# Patient Record
Sex: Female | Born: 1995 | Race: White | Hispanic: No | Marital: Single | Smoking: Never smoker
Health system: Southern US, Community
[De-identification: ages and names within clinical notes are randomized; demographics above are authoritative.]

## PROBLEM LIST (undated history)

## (undated) DIAGNOSIS — R7303 Prediabetes: Secondary | ICD-10-CM

## (undated) DIAGNOSIS — E669 Obesity, unspecified: Secondary | ICD-10-CM

## (undated) DIAGNOSIS — L0591 Pilonidal cyst without abscess: Secondary | ICD-10-CM

## (undated) DIAGNOSIS — Z6841 Body Mass Index (BMI) 40.0 and over, adult: Secondary | ICD-10-CM

## (undated) DIAGNOSIS — N912 Amenorrhea, unspecified: Secondary | ICD-10-CM

## (undated) DIAGNOSIS — M419 Scoliosis, unspecified: Secondary | ICD-10-CM

## (undated) DIAGNOSIS — R030 Elevated blood-pressure reading, without diagnosis of hypertension: Secondary | ICD-10-CM

## (undated) HISTORY — PX: TONSILLECTOMY AND ADENOIDECTOMY: SHX28

## (undated) HISTORY — DX: Body Mass Index (BMI) 40.0 and over, adult: Z684

## (undated) HISTORY — DX: Amenorrhea, unspecified: N91.2

---
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
## 2017-01-07 IMAGING — US US ABDOMEN COMPLETE
1 series · 13 of 25 positions shown · non-contrast
Comparison: None.

CLINICAL DATA: Generalized abdominal pain and nausea, chronic

EXAM:
ABDOMEN ULTRASOUND COMPLETE

[Series 1: us abdomen complete · 0.18mm/px · 13 of 108 slices shown]
[im 1/108]
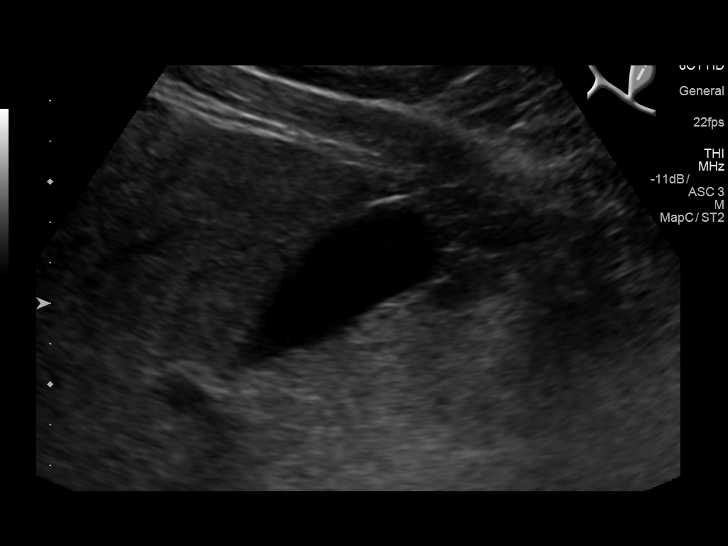
[im 9/108]
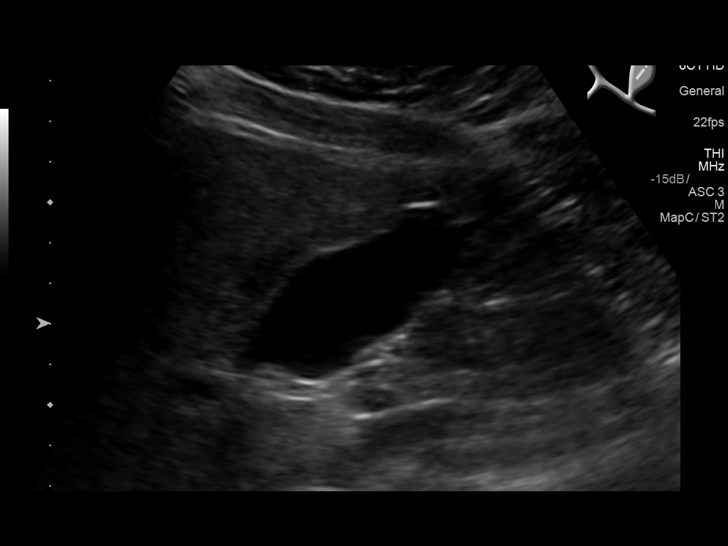
[im 18/108]
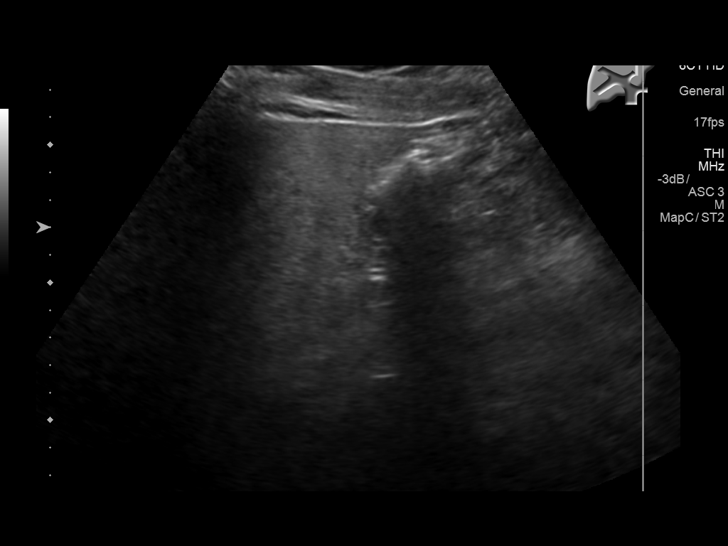
[im 27/108]
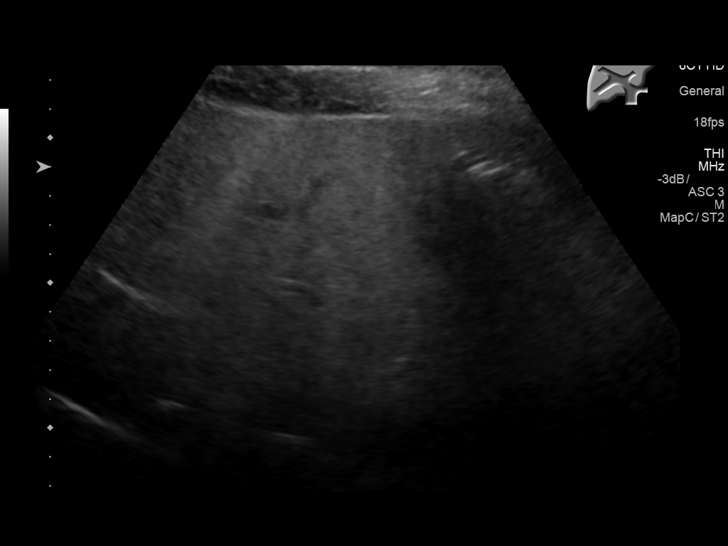
[im 36/108]
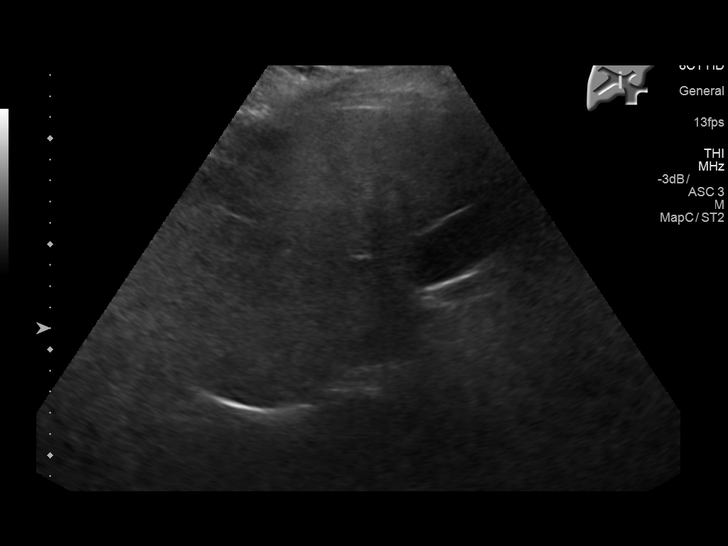
[im 45/108]
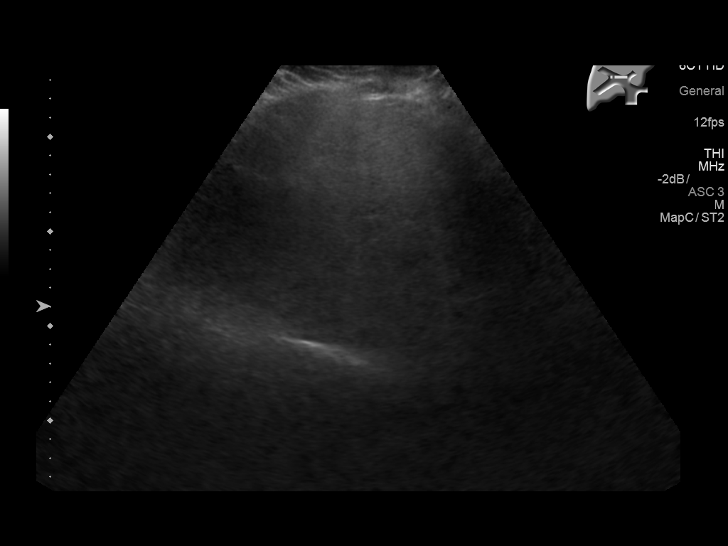
[im 54/108]
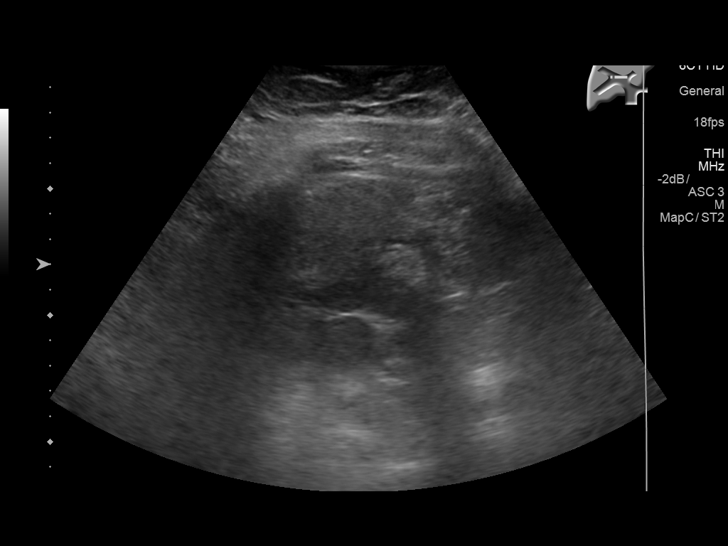
[im 63/108]
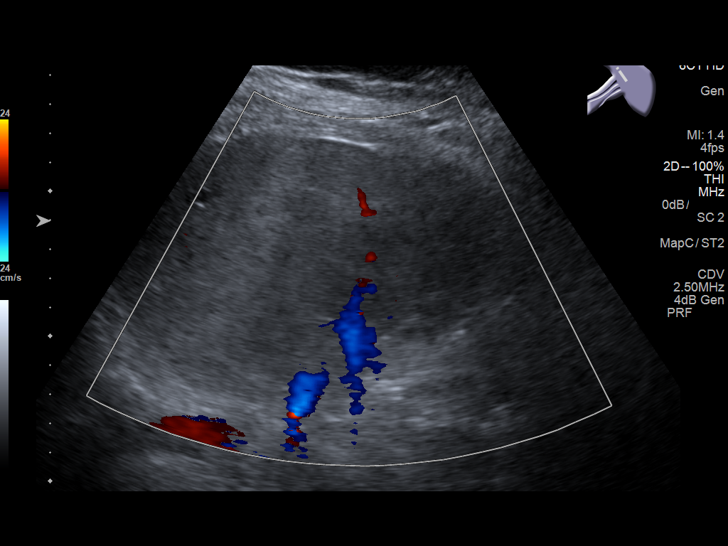
[im 72/108]
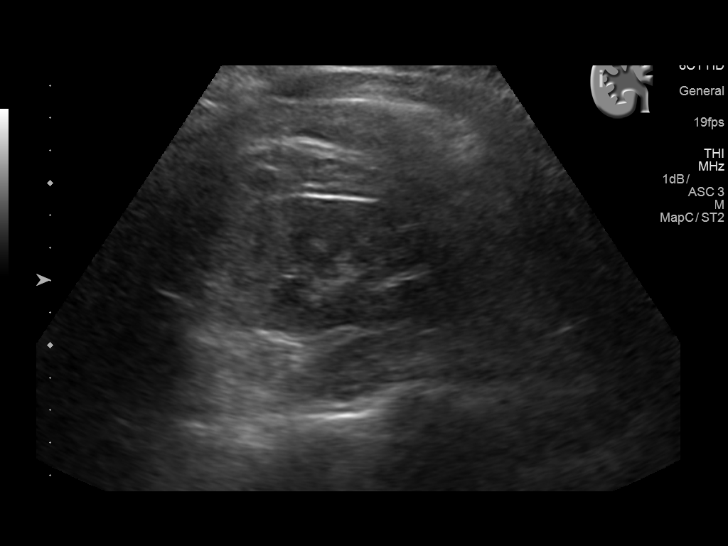
[im 81/108]
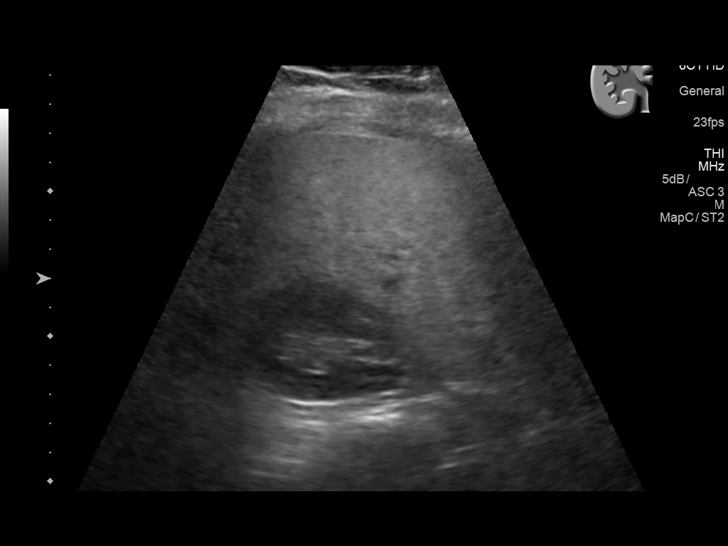
[im 90/108]
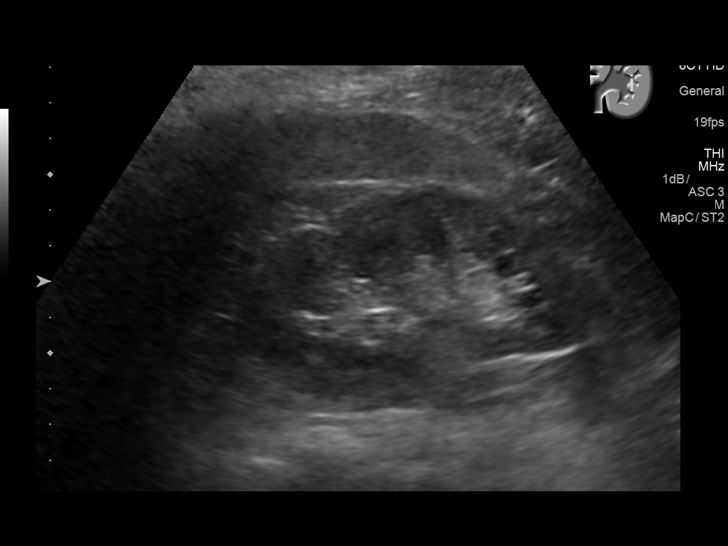
[im 99/108]
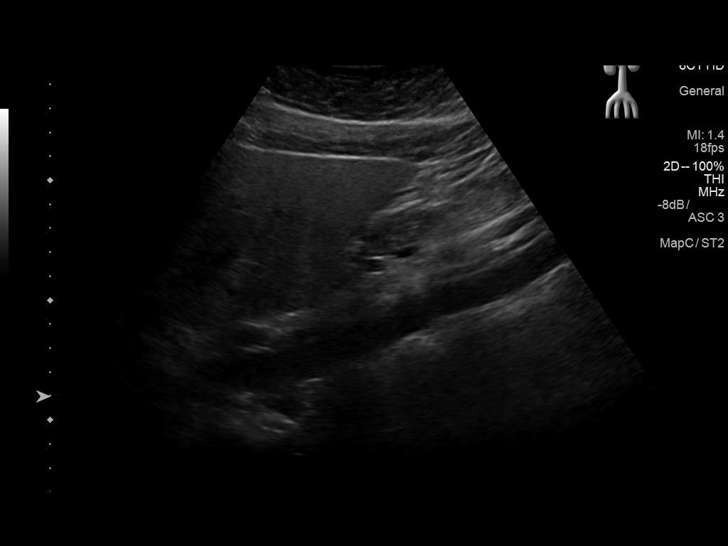
[im 108/108]
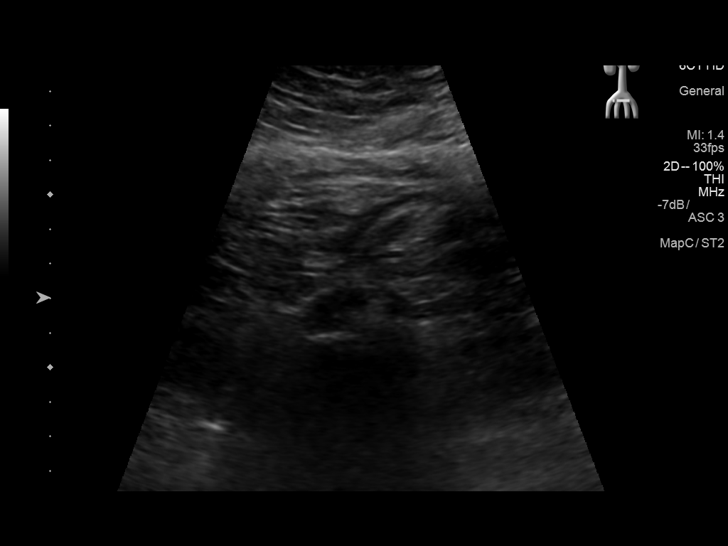

[13 of 25 positions shown; findings below may reference images not displayed]

FINDINGS: Gallbladder: No gallstones or wall thickening visualized. There is
no pericholecystic fluid. No sonographic Murphy sign noted by
sonographer.

Common bile duct: Diameter: 2 mm. There is no intrahepatic, common
hepatic, or common bile duct dilatation.

Liver: No focal lesion identified. Liver echogenicity overall is
increased diffusely.

IVC: No abnormality visualized.

Pancreas: Visualized portion unremarkable. Portions of pancreas
obscured by gas.

Spleen: Spleen is prominent, measuring 12.5 x 13.7 x 5.1 cm with a
measures splenic volume of 457 cubic cm. No focal splenic lesions
are identified.

Right Kidney: Length: 11.1 cm. Echogenicity within normal limits. No
mass or hydronephrosis visualized.

Left Kidney: Length: 11.3 cm. Echogenicity within normal limits. No
mass or hydronephrosis visualized.

Abdominal aorta: No aneurysm visualized.

Other findings: No demonstrable ascites.
IMPRESSION: Diffuse increased liver echogenicity, a finding most likely due to
hepatic steatosis. While no focal liver lesions are identified, it
must be cautioned that the sensitivity of ultrasound for focal liver
lesions is diminished in this circumstance.

Spleen prominent without focal splenic lesion.

Portions of pancreas obscured by gas. Visualized portions of
pancreas appear normal.

Study otherwise unremarkable.

## 2017-03-20 ENCOUNTER — Ambulatory Visit
Admission: RE | Admit: 2017-03-20 | Discharge: 2017-03-20 | Disposition: A | Discharge status: Home / Self Care | Payer: Medicaid Other | Source: Ambulatory Visit | Attending: Pediatrics | Admitting: Pediatrics

## 2017-03-20 ENCOUNTER — Other Ambulatory Visit: Payer: Self-pay | Admitting: Pediatrics

## 2017-03-20 DIAGNOSIS — M545 Low back pain: Secondary | ICD-10-CM | POA: Insufficient documentation

## 2017-03-20 DIAGNOSIS — M419 Scoliosis, unspecified: Secondary | ICD-10-CM | POA: Diagnosis not present

## 2017-09-28 ENCOUNTER — Ambulatory Visit
Admission: RE | Admit: 2017-09-28 | Discharge: 2017-09-28 | Disposition: A | Discharge status: Home / Self Care | Payer: Medicaid Other | Source: Ambulatory Visit | Attending: Allergy and Immunology | Admitting: Allergy and Immunology

## 2017-09-28 ENCOUNTER — Other Ambulatory Visit: Payer: Self-pay | Admitting: Allergy and Immunology

## 2017-09-28 DIAGNOSIS — X58XXXA Exposure to other specified factors, initial encounter: Secondary | ICD-10-CM | POA: Diagnosis not present

## 2017-09-28 DIAGNOSIS — T783XXA Angioneurotic edema, initial encounter: Secondary | ICD-10-CM

## 2018-06-18 IMAGING — CR DG CHEST 2V
2 series · 2 of 2 positions shown · non-contrast
Comparison: None.

CLINICAL DATA: Injury edema. No chest pain. No shortness of breath.

EXAM:
CHEST  2 VIEW

[chest pa]
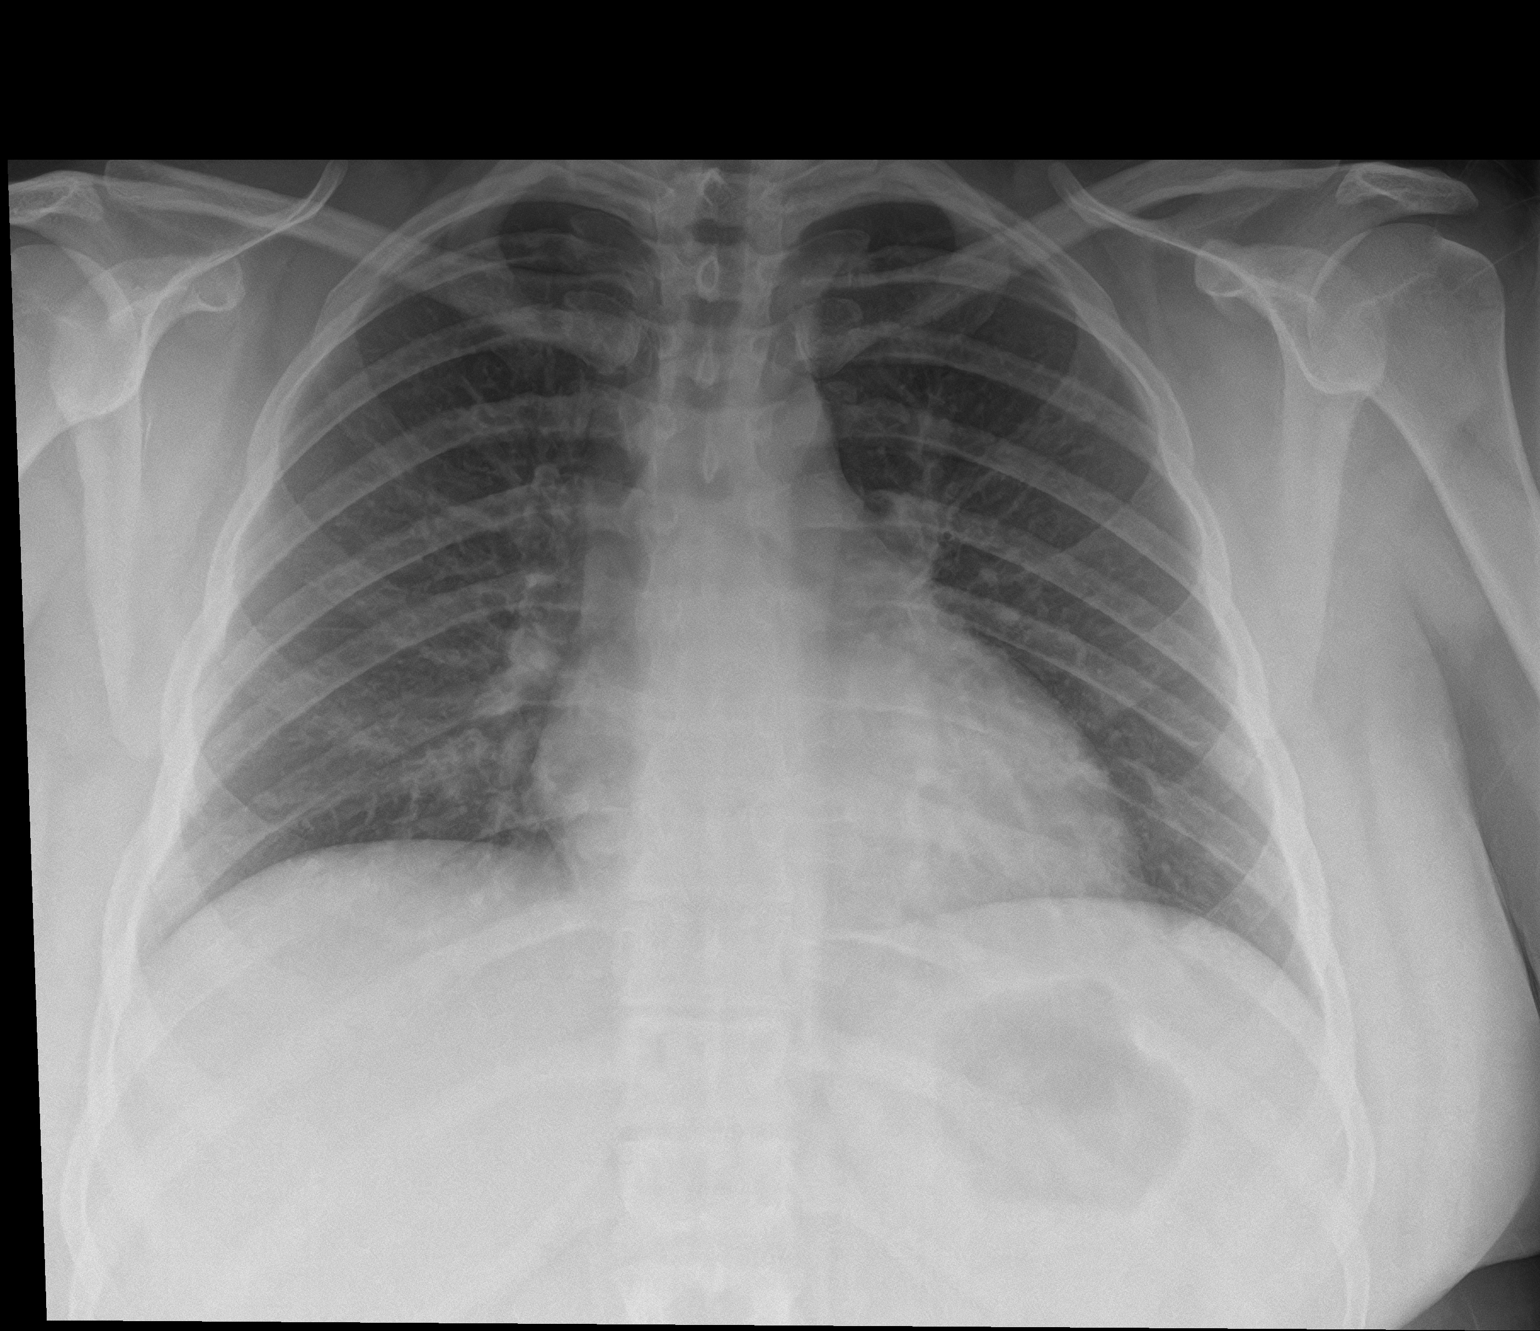

[chest lat]
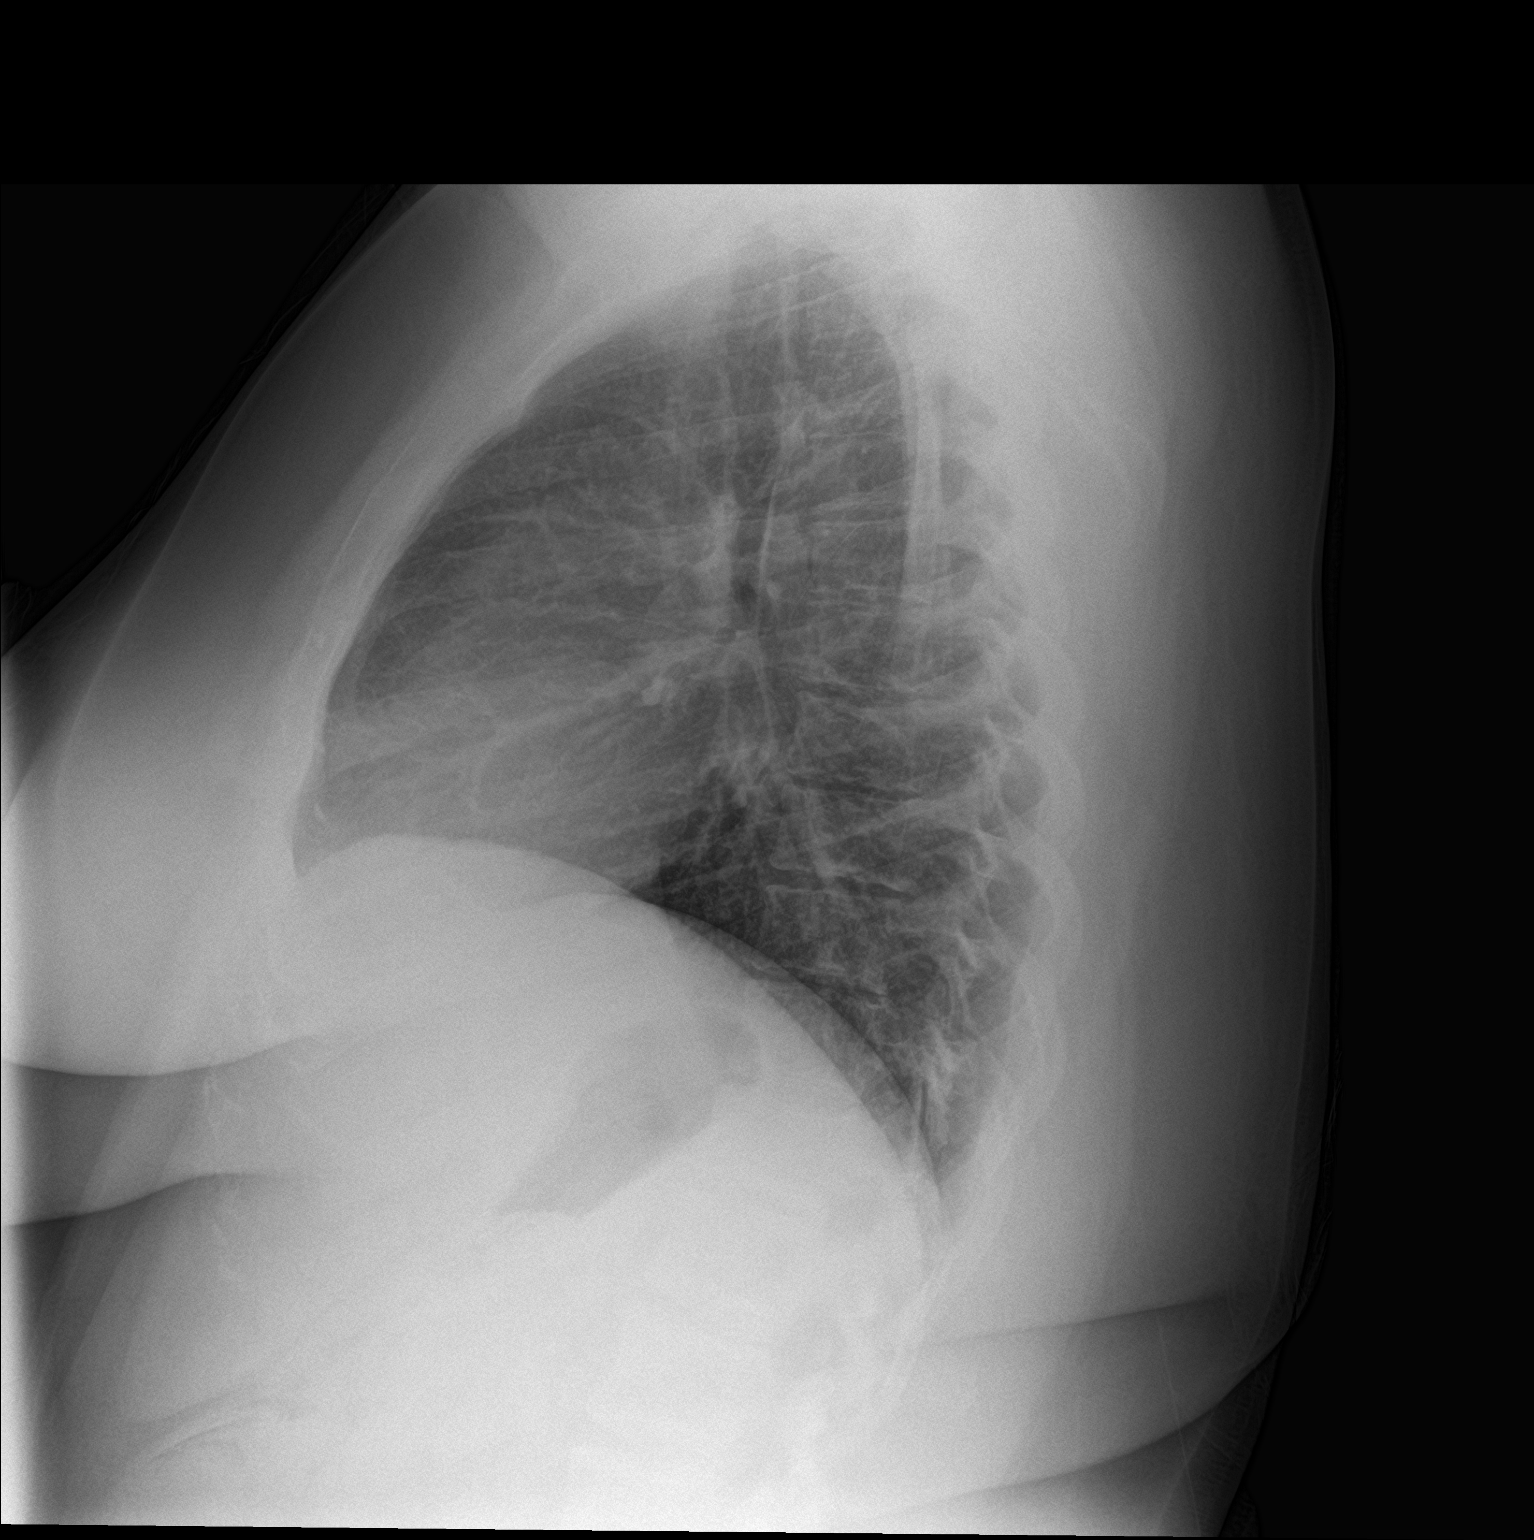

[2 of 2 positions shown; findings below may reference images not displayed]

FINDINGS: The heart size and mediastinal contours are within normal limits.
Both lungs are clear. The visualized skeletal structures are
unremarkable.
IMPRESSION: No active cardiopulmonary disease.

## 2020-03-27 NOTE — Patient Instructions (Signed)
I value your feedback and entrusting us with your care. If you get a [facility] patient survey, I would appreciate you taking the time to let us know about your experience today. Thank you!  As of December 08, 2019, your lab results will be released to your MyChart immediately, before I even have a chance to see them. Please give me time to review them and contact you if there are any abnormalities. Thank you for your patience.  

## 2020-03-27 NOTE — Progress Notes (Signed)
PCP:  Nicola Girt, PA-C   Chief Complaint  Patient presents with  . Gynecologic Exam    pt hasnt had a period for at least a year     HPI:      [name] is a 24 y.o. No obstetric history on file. who LMP was No LMP recorded. (Menstrual status: Irregular Periods)., presents today for her NP annual examination.  Her menses have been absent for the past year and infrequent since about age 35. May have 1 day light spotting with wiping randomly. Menarche age [age] with monthly menses, lasting 7 days, for a couple yrs. Dysmenorrhea none. She does not have intermenstrual bleeding. Never had any eval for amenorrhea/PCOS. No acne/hirsutism.   Sex activity: never active Last Pap: N/A in past due to age Hx of STDs: none  There is no FH of breast cancer. There is a FH of ovarian cancer in her MGM, pt to confirm dx. The patient does not do self-breast exams.  Tobacco use: The patient denies current or previous tobacco use. Alcohol use: social drinker No drug use.  Exercise: not active  She does get adequate calcium but not Vitamin D in her diet. Unsure if Gardasil done.   Pt has FP MCD  Past Medical History:  Diagnosis Date  . Amenorrhea   . BMI 45.0-49.9, adult [facility])     Past Surgical History:  Procedure Laterality Date  . TONSILLECTOMY AND ADENOIDECTOMY      Family History  Problem Relation Age of Onset  . Crohn's disease Brother   . Ovarian cancer Maternal Grandmother     Social History   Socioeconomic History  . Marital status: Single    Spouse name: Not on file  . Number of children: Not on file  . Years of education: Not on file  . Highest education level: Not on file  Occupational History  . Not on file  Tobacco Use  . Smoking status: Never Smoker  . Smokeless tobacco: Never Used  Substance and Sexual Activity  . Alcohol use: Yes  . Drug use: Never  . Sexual activity: Not Currently    Birth control/protection: None  Other Topics  Concern  . Not on file  Social History Narrative  . Not on file   Social Determinants of Health   Financial Resource Strain:   . Difficulty of Paying Living Expenses:   Food Insecurity:   . Worried About Charity fundraiser in the Last Year:   . Arboriculturist in the Last Year:   Transportation Needs:   . Film/video editor (Medical):   Marland Kitchen Lack of Transportation (Non-Medical):   Physical Activity:   . Days of Exercise per Week:   . Minutes of Exercise per Session:   Stress:   . Feeling of Stress :   Social Connections:   . Frequency of Communication with Friends and Family:   . Frequency of Social Gatherings with Friends and Family:   . Attends Religious Services:   . Active Member of Clubs or Organizations:   . Attends Archivist Meetings:   Marland Kitchen Marital Status:   Intimate Partner Violence:   . Fear of Current or Ex-Partner:   . Emotionally Abused:   Marland Kitchen Physically Abused:   . Sexually Abused:      Current Outpatient Medications:  .  medroxyPROGESTERone (PROVERA) 10 MG tablet, Take 1 tablet (10 mg total) by mouth daily for 7 days., Disp: 7 tablet, Rfl:  0     ROS:  Review of Systems  Constitutional: Negative for fatigue, fever and unexpected weight change.  Respiratory: Negative for cough, shortness of breath and wheezing.   Cardiovascular: Negative for chest pain, palpitations and leg swelling.  Gastrointestinal: Negative for blood in stool, constipation, diarrhea, nausea and vomiting.  Endocrine: Negative for cold intolerance, heat intolerance and polyuria.  Genitourinary: Positive for menstrual problem. Negative for dyspareunia, dysuria, flank pain, frequency, genital sores, hematuria, pelvic pain, urgency, vaginal bleeding, vaginal discharge and vaginal pain.  Musculoskeletal: Negative for back pain, joint swelling and myalgias.  Skin: Negative for rash.  Neurological: Negative for dizziness, syncope, light-headedness, numbness and headaches.    Hematological: Negative for adenopathy.  Psychiatric/Behavioral: Negative for agitation, confusion, sleep disturbance and suicidal ideas. The patient is not nervous/anxious.   BREAST: No symptoms   Objective: BP 132/80   Ht 5\' 4"  (1.626 m)   Wt 287 lb (130.2 kg)   BMI 49.26 kg/m    Physical Exam Constitutional:      Appearance: She is well-developed.  Genitourinary:     Vulva, vagina, cervix, uterus, right adnexa and left adnexa normal.     No vulval lesion or tenderness noted.     No vaginal discharge, erythema or tenderness.     No cervical polyp.     Uterus is not enlarged or tender.     No right or left adnexal mass present.     Right adnexa not tender.     Left adnexa not tender.  Neck:     Thyroid: No thyromegaly.  Cardiovascular:     Rate and Rhythm: Normal rate and regular rhythm.     Heart sounds: Normal heart sounds. No murmur.  Pulmonary:     Effort: Pulmonary effort is normal.     Breath sounds: Normal breath sounds.  Chest:     Breasts:        Right: No mass, nipple discharge, skin change or tenderness.        Left: No mass, nipple discharge, skin change or tenderness.  Abdominal:     Palpations: Abdomen is soft.     Tenderness: There is no abdominal tenderness. There is no guarding.  Musculoskeletal:        General: Normal range of motion.     Cervical back: Normal range of motion.  Neurological:     General: No focal deficit present.     Mental Status: She is alert and oriented to person, place, and time.     Cranial Nerves: No cranial nerve deficit.  Skin:    General: Skin is warm and dry.  Psychiatric:        Mood and Affect: Mood normal.        Behavior: Behavior normal.        Thought Content: Thought content normal.        Judgment: Judgment normal.  Vitals reviewed.     Assessment/Plan: Encounter for annual routine gynecological examination  Cervical cancer screening - Plan: Cytology - PAP  Amenorrhea - Plan: medroxyPROGESTERone  (PROVERA) 10 MG tablet; Discussed PCOS vs weight vs other. Rx provera. F/u in 2 1/2 wks via phone re: withdrawal bleed. If has bleeding, suggested Q90 day provera vs OCPs for cycle control. Discussed importance of bleeding. If no withdrawal bleed, recommend further eval, but pt has FP MCD so would be self-pay.   Weight loss counseling--MyFitness Pal/exercise.   Family history of ovarian cancer--pt to confirm FH with mom.   Meds  ordered this encounter  Medications  . medroxyPROGESTERone (PROVERA) 10 MG tablet    Sig: Take 1 tablet (10 mg total) by mouth daily for 7 days.    Dispense:  7 tablet    Refill:  0    Order Specific Question:   Supervising Provider    Answer:   Nadara Mustard [[id number]]             GYN counsel adequate intake of calcium and vitamin D, diet and exercise; Gardasil discussed--pt to check with parents.     F/U  Return in about 1 year (around 03/28/2021).  Nyasha Rahilly B. Atziry Baranski, PA-C 03/28/2020 10:37 AM

## 2020-03-28 ENCOUNTER — Encounter: Payer: Self-pay | Admitting: Obstetrics and Gynecology

## 2020-03-28 ENCOUNTER — Other Ambulatory Visit: Payer: Self-pay

## 2020-03-28 ENCOUNTER — Other Ambulatory Visit (HOSPITAL_COMMUNITY)
Admission: RE | Admit: 2020-03-28 | Discharge: 2020-03-28 | Disposition: A | Discharge status: Home / Self Care | Payer: Medicaid Other | Source: Ambulatory Visit | Attending: Obstetrics and Gynecology | Admitting: Obstetrics and Gynecology

## 2020-03-28 ENCOUNTER — Ambulatory Visit (INDEPENDENT_AMBULATORY_CARE_PROVIDER_SITE_OTHER): Payer: Medicaid Other | Admitting: Obstetrics and Gynecology

## 2020-03-28 VITALS — BP 132/80 | Ht 64.0 in | Wt 287.0 lb

## 2020-03-28 DIAGNOSIS — Z01419 Encounter for gynecological examination (general) (routine) without abnormal findings: Secondary | ICD-10-CM

## 2020-03-28 DIAGNOSIS — Z124 Encounter for screening for malignant neoplasm of cervix: Secondary | ICD-10-CM | POA: Diagnosis present

## 2020-03-28 DIAGNOSIS — N912 Amenorrhea, unspecified: Secondary | ICD-10-CM | POA: Insufficient documentation

## 2020-03-28 DIAGNOSIS — Z0000 Encounter for general adult medical examination without abnormal findings: Secondary | ICD-10-CM | POA: Diagnosis not present

## 2020-03-28 DIAGNOSIS — Z8041 Family history of malignant neoplasm of ovary: Secondary | ICD-10-CM

## 2020-03-28 DIAGNOSIS — Z713 Dietary counseling and surveillance: Secondary | ICD-10-CM

## 2020-03-28 MED ORDER — MEDROXYPROGESTERONE ACETATE 10 MG PO TABS: 10.0000 mg | ORAL_TABLET | Freq: Every day | ORAL | 0 refills | Status: DC

## 2020-03-29 LAB — CYTOLOGY - PAP: Diagnosis: NEGATIVE

## 2020-04-09 ENCOUNTER — Encounter: Payer: Self-pay | Admitting: Obstetrics and Gynecology

## 2020-04-10 ENCOUNTER — Other Ambulatory Visit: Payer: Self-pay | Admitting: Obstetrics and Gynecology

## 2020-04-10 MED ORDER — MICROGESTIN 24 FE 1-20 MG-MCG PO TABS: 1.0000 | ORAL_TABLET | Freq: Every day | ORAL | 3 refills | Status: DC

## 2020-04-10 NOTE — Progress Notes (Signed)
OCP start after withdrawal bleed with provera.

## 2020-06-20 ENCOUNTER — Encounter: Payer: Self-pay | Admitting: Obstetrics and Gynecology

## 2020-10-01 ENCOUNTER — Other Ambulatory Visit: Payer: Medicaid Other

## 2020-10-01 DIAGNOSIS — Z20822 Contact with and (suspected) exposure to covid-19: Secondary | ICD-10-CM

## 2020-10-02 LAB — NOVEL CORONAVIRUS, NAA: SARS-CoV-2, NAA: DETECTED — AB

## 2020-10-02 LAB — SARS-COV-2, NAA 2 DAY TAT

## 2021-08-15 NOTE — Progress Notes (Addendum)
PCP:  Serita Grit, PA-C   Chief Complaint  Patient presents with   Gynecologic Exam    Hasn't had a period since march/April, ran out of bc     HPI:      [name] is a 25 y.o. No obstetric history on file. who LMP was No LMP recorded (lmp unknown). (Menstrual status: Irregular Periods)., presents today for her annual examination.  Her menses were monthly with OCPs last yr, lasting 7 days, mod flow, no BTB, mild dysmen, improved with NSAIDs. Hx of amenorrhea off OCPs for past couple of yrs. Menarche age [age] with monthly menses, lasting 7 days, for a couple yrs then no menses. Never had any eval for amenorrhea/PCOS. No acne/hirsutism. Didn't do labs last yr since pt has FP MCD and would be considered self pay. Did have withdrawal bleed with provera and cycle control will pills.   Sex activity: currently sex active--contraception: was on OCPs, but none since ran out of OCPS. Had neg UPT (not sure how long ago) Last Pap: 03/28/20 Results were normal Hx of STDs: none  There is no FH of breast cancer. There is a FH of ovarian cancer in her MGM and MGGM, pt confirmed dx with her mom. Genetic testing not done. The patient does not do self-breast exams.  Tobacco use: The patient denies current or previous tobacco use. Alcohol use: social drinker No drug use.  Exercise: not active  She does get adequate calcium but not Vitamin D in her diet. Unsure if Gardasil done.   Pt has FP MCD  Past Medical History:  Diagnosis Date   Amenorrhea    BMI 45.0-49.9, adult [facility])     Past Surgical History:  Procedure Laterality Date   TONSILLECTOMY AND ADENOIDECTOMY      Family History  Problem Relation Age of Onset   Crohn's disease Brother    Ovarian cancer Maternal Grandmother    Ovarian cancer Maternal Great-grandmother     Social History   Socioeconomic History   Marital status: Single    Spouse name: Not on file   Number of children: Not on file   Years of  education: Not on file   Highest education level: Not on file  Occupational History   Not on file  Tobacco Use   Smoking status: Never   Smokeless tobacco: Never  Vaping Use   Vaping Use: Never used  Substance and Sexual Activity   Alcohol use: Yes   Drug use: Never   Sexual activity: Yes    Birth control/protection: None  Other Topics Concern   Not on file  Social History Narrative   Not on file   Social Determinants of Health   Financial Resource Strain: Not on file  Food Insecurity: Not on file  Transportation Needs: Not on file  Physical Activity: Not on file  Stress: Not on file  Social Connections: Not on file  Intimate Partner Violence: Not on file     Current Outpatient Medications:    medroxyPROGESTERone (PROVERA) 10 MG tablet, Take 1 tablet (10 mg total) by mouth daily for 7 days., Disp: 7 tablet, Rfl: 0   Norethindrone Acetate-Ethinyl Estrad-FE (MICROGESTIN 24 FE) 1-20 MG-MCG(24) tablet, Take 1 tablet by mouth daily., Disp: 84 tablet, Rfl: 3     ROS:  Review of Systems  Constitutional:  Negative for fatigue, fever and unexpected weight change.  Respiratory:  Negative for cough, shortness of breath and wheezing.   Cardiovascular:  Negative for  chest pain, palpitations and leg swelling.  Gastrointestinal:  Negative for blood in stool, constipation, diarrhea, nausea and vomiting.  Endocrine: Negative for cold intolerance, heat intolerance and polyuria.  Genitourinary:  Positive for menstrual problem. Negative for dyspareunia, dysuria, flank pain, frequency, genital sores, hematuria, pelvic pain, urgency, vaginal bleeding, vaginal discharge and vaginal pain.  Musculoskeletal:  Negative for back pain, joint swelling and myalgias.  Skin:  Negative for rash.  Neurological:  Negative for dizziness, syncope, light-headedness, numbness and headaches.  Hematological:  Negative for adenopathy.  Psychiatric/Behavioral:  Negative for agitation, confusion, sleep  disturbance and suicidal ideas. The patient is not nervous/anxious.  BREAST: No symptoms   Objective: BP 120/80   Ht 5\' 4"  (1.626 m)   Wt 284 lb (128.8 kg)   LMP  (LMP Unknown)   BMI 48.75 kg/m    Physical Exam Constitutional:      Appearance: She is well-developed.  Genitourinary:     Vulva normal.     Right Labia: No rash, tenderness or lesions.    Left Labia: No tenderness, lesions or rash.    No vaginal discharge, erythema or tenderness.      Right Adnexa: not tender and no mass present.    Left Adnexa: not tender and no mass present.    No cervical friability or polyp.     Uterus is not enlarged or tender.  Breasts:    Right: No mass, nipple discharge, skin change or tenderness.     Left: No mass, nipple discharge, skin change or tenderness.  Neck:     Thyroid: No thyromegaly.  Cardiovascular:     Rate and Rhythm: Normal rate and regular rhythm.     Heart sounds: Normal heart sounds. No murmur heard. Pulmonary:     Effort: Pulmonary effort is normal.     Breath sounds: Normal breath sounds.  Abdominal:     Palpations: Abdomen is soft.     Tenderness: There is no abdominal tenderness. There is no guarding or rebound.  Musculoskeletal:        General: Normal range of motion.     Cervical back: Normal range of motion.  Lymphadenopathy:     Cervical: No cervical adenopathy.  Neurological:     General: No focal deficit present.     Mental Status: She is alert and oriented to person, place, and time.     Cranial Nerves: No cranial nerve deficit.  Skin:    General: Skin is warm and dry.  Psychiatric:        Mood and Affect: Mood normal.        Behavior: Behavior normal.        Thought Content: Thought content normal.        Judgment: Judgment normal.  Vitals reviewed.    Assessment/Plan: Encounter for annual routine gynecological examination  Screening for STD (sexually transmitted disease) - Plan: Cervicovaginal ancillary only  Family history of ovarian  cancer--MyRisk testing discussed and pt to f/u if desires.   Amenorrhea - Plan: medroxyPROGESTERone (PROVERA) 10 MG tablet; pt to do Rx provera after neg first AM UPT, then restart OCPs. F/u if no withdrawal bleed.  Encounter for surveillance of contraceptive pills - Plan: Norethindrone Acetate-Ethinyl Estrad-FE (MICROGESTIN 24 FE) 1-20 MG-MCG(24) tablet; OCP RF. F/u prn.    Meds ordered this encounter  Medications   medroxyPROGESTERone (PROVERA) 10 MG tablet    Sig: Take 1 tablet (10 mg total) by mouth daily for 7 days.    Dispense:  7  tablet    Refill:  0    Order Specific Question:   Supervising Provider    Answer:   Nadara Mustard [[id number]]   Norethindrone Acetate-Ethinyl Estrad-FE (MICROGESTIN 24 FE) 1-20 MG-MCG(24) tablet    Sig: Take 1 tablet by mouth daily.    Dispense:  84 tablet    Refill:  3    Order Specific Question:   Supervising Provider    Answer:   Nadara Mustard [[id number]]              GYN counsel adequate intake of calcium and vitamin D, diet and exercise   F/U  Return in about 1 year (around 08/19/2022).  Negar Sieler B. Jacquita Mulhearn, PA-C 08/19/2021 9:25 AM

## 2021-08-19 ENCOUNTER — Encounter: Payer: Self-pay | Admitting: Obstetrics and Gynecology

## 2021-08-19 ENCOUNTER — Ambulatory Visit (INDEPENDENT_AMBULATORY_CARE_PROVIDER_SITE_OTHER): Payer: Medicaid Other | Admitting: Obstetrics and Gynecology

## 2021-08-19 ENCOUNTER — Other Ambulatory Visit: Payer: Self-pay

## 2021-08-19 ENCOUNTER — Other Ambulatory Visit (HOSPITAL_COMMUNITY)
Admission: RE | Admit: 2021-08-19 | Discharge: 2021-08-19 | Disposition: A | Discharge status: Home / Self Care | Payer: Medicaid Other | Source: Ambulatory Visit | Attending: Obstetrics and Gynecology | Admitting: Obstetrics and Gynecology

## 2021-08-19 VITALS — BP 120/80 | Ht 64.0 in | Wt 284.0 lb

## 2021-08-19 DIAGNOSIS — Z113 Encounter for screening for infections with a predominantly sexual mode of transmission: Secondary | ICD-10-CM | POA: Insufficient documentation

## 2021-08-19 DIAGNOSIS — Z01419 Encounter for gynecological examination (general) (routine) without abnormal findings: Secondary | ICD-10-CM

## 2021-08-19 DIAGNOSIS — N912 Amenorrhea, unspecified: Secondary | ICD-10-CM

## 2021-08-19 DIAGNOSIS — Z8041 Family history of malignant neoplasm of ovary: Secondary | ICD-10-CM | POA: Diagnosis not present

## 2021-08-19 DIAGNOSIS — Z3041 Encounter for surveillance of contraceptive pills: Secondary | ICD-10-CM

## 2021-08-19 MED ORDER — MEDROXYPROGESTERONE ACETATE 10 MG PO TABS: 10.0000 mg | ORAL_TABLET | Freq: Every day | ORAL | 0 refills | Status: DC

## 2021-08-19 MED ORDER — MICROGESTIN 24 FE 1-20 MG-MCG PO TABS: 1.0000 | ORAL_TABLET | Freq: Every day | ORAL | 3 refills | Status: DC

## 2021-08-20 LAB — CERVICOVAGINAL ANCILLARY ONLY
Chlamydia: NEGATIVE
Comment: NEGATIVE
Comment: NORMAL
Neisseria Gonorrhea: NEGATIVE

## 2022-07-23 ENCOUNTER — Ambulatory Visit (INDEPENDENT_AMBULATORY_CARE_PROVIDER_SITE_OTHER): Payer: Self-pay | Admitting: Advanced Practice Midwife

## 2022-07-23 VITALS — BP 126/84 | Ht 64.0 in | Wt 310.0 lb

## 2022-07-23 DIAGNOSIS — L83 Acanthosis nigricans: Secondary | ICD-10-CM

## 2022-07-23 DIAGNOSIS — N912 Amenorrhea, unspecified: Secondary | ICD-10-CM

## 2022-07-23 DIAGNOSIS — Z6841 Body Mass Index (BMI) 40.0 and over, adult: Secondary | ICD-10-CM

## 2022-07-23 DIAGNOSIS — L814 Other melanin hyperpigmentation: Secondary | ICD-10-CM

## 2022-07-23 NOTE — Patient Instructions (Addendum)
Acanthosis Nigricans Acanthosis nigricans is a condition in which dark, velvety markings appear on the skin. What are the causes? This condition may be caused by: A hormonal or glandular disorder, such as diabetes. Obesity. Certain medicines, such as birth control pills. A tumor. This is rare. Some people inherit the condition from their parents. What increases the risk? You are more likely to develop this condition if you: Have a hormonal or glandular disorder. Are overweight. Take certain medicines. Have certain cancers, especially stomach cancer. Have dark-colored skin (dark complexion). What are the signs or symptoms? The main symptom of this condition is velvety markings on the skin that are light brown, black, or grayish in color. The markings usually appear on the face. They may also appear in skin fold areas at the neck, armpits, inner thighs, and groin. In severe cases, markings may also appear on the lips, hands, breasts, eyelids, and mouth. How is this diagnosed? This condition may be diagnosed based on your symptoms. A skin sample may be removed for testing (skin biopsy). You may also have tests to help determine the cause of the condition. How is this treated? Treatment for this condition depends on the cause. Treatment may involve reducing insulin levels, which are often high in people who have this condition. Insulin levels can be reduced with: Dietary changes, such as avoiding starchy foods and sugars. Losing weight. Medicines. Sometimes, treatment involves: Medicines to improve the appearance of the skin. Laser treatment to improve the appearance of the skin. Surgical removal of the skin markings (dermabrasion). Follow these instructions at home: Follow diet instructions from your health care provider. Lose weight if you are overweight. Take over-the-counter and prescription medicines only as told by your health care provider. Keep all follow-up visits as told by  your health care provider. This is important. Contact a health care provider if: New skin markings develop on a part of the body where they rarely develop, such as on your lips, hands, breasts, eyelids, or mouth. The condition recurs for an unknown reason. Summary Acanthosis nigricans is a condition in which dark, velvety markings appear on the skin. Treatment for this condition depends on the cause. Treatment may include dietary changes, medicines, laser treatment, or surgery. Take over-the-counter and prescription medicines only as told by your health care provider. Contact a health care provider if new skin markings develop on a part of the body where they rarely develop, such as on your lips, hands, breasts, eyelids, or mouth. Keep all follow-up visits as told by your health care provider. This is important. This information is not intended to replace advice given to you by your health care provider. Make sure you discuss any questions you have with your health care provider. Document Revised: 10/10/2021 Document Reviewed: 10/10/2021 Elsevier Patient Education  2023 [employer]. Polycystic Ovary Syndrome  Polycystic ovarian syndrome (PCOS) is a common hormonal disorder among women of reproductive age. In most women with PCOS, small fluid-filled sacs (cysts) grow on the ovaries. PCOS can cause problems with menstrual periods and make it hard to get and stay pregnant. If this condition is not treated, it can lead to serious health problems, such as diabetes and heart disease. What are the causes? The cause of this condition is not known. It may be due to certain factors, such as: Irregular menstrual cycle. High levels of certain hormones. Problems with the hormone that helps to control blood sugar (insulin). Certain genes. What increases the risk? You are more likely to develop this  condition if you: Have a family history of PCOS or type 2 diabetes. Are overweight, eat unhealthy foods,  and are not active. These factors may cause problems with blood sugar control, which can contribute to PCOS or PCOS symptoms. What are the signs or symptoms? Symptoms of this condition include: Ovarian cysts and sometimes pelvic pain. Menstrual periods that are not regular or are too heavy. Inability to get or stay pregnant. Increased growth of hair on the face, chest, stomach, back, thumbs, thighs, or toes. Acne or oily skin. Acne may develop during adulthood, and it may not get better with treatment. Weight gain or obesity. Patches of thickened and dark brown or black skin on the neck, arms, breasts, or thighs. How is this diagnosed? This condition is diagnosed based on: Your medical history. A physical exam that includes a pelvic exam. Your health care provider may look for areas of increased hair growth on your skin. Tests, such as: An ultrasound to check the ovaries for cysts and to view the lining of the uterus. Blood tests to check levels of sugar (glucose), female hormone (testosterone), and female hormones (estrogen and progesterone). How is this treated? There is no cure for this condition, but treatment can help to manage symptoms and prevent more health problems from developing. Treatment varies depending on your symptoms and if you want to have a baby or if you need birth control. Treatment may include: Making nutrition and lifestyle changes. Taking the progesterone hormone to start a menstrual period. Taking birth control pills to help you have regular menstrual periods. Taking medicines such as: Medicines to make you ovulate, if you want to get pregnant. Medicine to reduce extra hair growth. Having surgery in severe cases. This may involve making small holes in one or both of your ovaries. This decreases the amount of testosterone that your body makes. Follow these instructions at home: Take over-the-counter and prescription medicines only as told by your health care  provider. Follow a healthy meal plan that includes lean proteins, complex carbohydrates, fresh fruits and vegetables, low-fat dairy products, healthy fats, and fiber. If you are overweight, lose weight as told by your health care provider. Your health care provider can determine how much weight loss is best for you and can help you lose weight safely. Keep all follow-up visits. This is important. Contact a health care provider if: Your symptoms do not get better with medicine. Your symptoms get worse or you develop new symptoms. Summary Polycystic ovarian syndrome (PCOS) is a common hormonal disorder among women of reproductive age. PCOS can cause problems with menstrual periods and make it hard to get and stay pregnant. If this condition is not treated, it can lead to serious health problems, such as diabetes and heart disease. There is no cure for this condition, but treatment can help to manage symptoms and prevent more health problems from developing. This information is not intended to replace advice given to you by your health care provider. Make sure you discuss any questions you have with your health care provider. Document Revised: 05/24/2020 Document Reviewed: 05/24/2020 Elsevier Patient Education  2023 [employer] Inc. Diet for Polycystic Ovary Syndrome Polycystic ovary syndrome (PCOS) is a common hormonal disorder that affects a woman's reproductive system. It can cause problems with menstrual periods and make it hard to get and stay pregnant. Changing what you eat can help your hormones reach normal levels, improve your health, and help you better manage PCOS. Following a balanced diet can help you lose  weight and improve the way that your body uses the hormone insulin to control blood sugar. This may include: Eating low-fat (lean) proteins, complex carbohydrates, fresh fruits and vegetables, low-fat dairy products, healthy fats, and fiber. Cutting down on calories. Exercising  regularly. What are tips for following this plan? Follow a balanced diet for meals and snacks. Eat breakfast, lunch, dinner, and one or two snacks every day. Include protein in each meal and snack. Choose whole grains instead of products that are made with refined flour. Eat a variety of foods. Exercise regularly as told by your health care provider. Aim to do at least 30 minutes of exercise on most days of the week. If you are overweight or obese: Pay attention to how many calories you eat. Cutting down on calories can help you lose weight. Work with your health care provider or a dietitian to figure out how many calories you need each day. What foods should I eat?  Fruits Include a variety of colors and types. All fruits are helpful for PCOS. Vegetables Include a variety of colors and types. All vegetables are helpful for PCOS. Grains Whole grains, such as whole wheat. Whole-grain breads, crackers, cereals, and pasta. Unsweetened oatmeal. Bulgur, barley, quinoa, and brown rice. Tortillas made from corn or whole-wheat flour. Meats and other proteins Lean proteins, such as fish, chicken, beans, eggs, and tofu. Dairy Low-fat dairy products, such as skim milk, cheese sticks, and yogurt. Beverages Low-fat or fat-free drinks, such as water, low-fat milk, sugar-free drinks, and small amounts of 100% fruit juice. Seasonings and condiments Ketchup. Mustard. Barbecue sauce. Relish. Low-fat or fat-free mayonnaise. Fats and oils Olive oil or canola oil. Walnuts and almonds. The items listed above may not be a complete list of recommended foods and beverages. Contact a dietitian for more options. What foods should I avoid? Foods that are high in calories or fat, especially saturated or trans fats. Fried foods. Sweets. Products that are made from refined white flour, including white bread, pastries, white rice, and pasta. The items listed above may not be a complete list of foods and beverages to  avoid. Contact a dietitian for more information. Summary PCOS is a hormonal imbalance that affects a woman's reproductive system. It can cause problems with menstrual periods and make it hard to get and stay pregnant. You can help to manage your PCOS by exercising regularly and eating a healthy, varied diet of vegetables, fruit, whole grains, lean protein, and low-fat dairy products. Changing what you eat can improve the way that your body uses insulin, help your hormones reach normal levels, and help you lose weight. This information is not intended to replace advice given to you by your health care provider. Make sure you discuss any questions you have with your health care provider. Document Revised: 05/24/2020 Document Reviewed: 05/24/2020 Elsevier Patient Education  [place] refers to food and lifestyle choices that are based on the traditions of countries located on the [employer]. It focuses on eating more fruits, vegetables, whole grains, beans, nuts, seeds, and heart-healthy fats, and eating less dairy, meat, eggs, and processed foods with added sugar, salt, and fat. This way of eating has been shown to help prevent certain conditions and improve outcomes for people who have chronic diseases, like kidney disease and heart disease. What are tips for following this plan? Reading food labels Check the serving size of packaged foods. For foods such as rice and pasta, the serving size refers  to the amount of cooked product, not dry. Check the total fat in packaged foods. Avoid foods that have saturated fat or trans fats. Check the ingredient list for added sugars, such as corn syrup. Shopping  Buy a variety of foods that offer a balanced diet, including: Fresh fruits and vegetables (produce). Grains, beans, nuts, and seeds. Some of these may be available in unpackaged forms or large amounts (in bulk). Fresh seafood. Poultry and  eggs. Low-fat dairy products. Buy whole ingredients instead of prepackaged foods. Buy fresh fruits and vegetables in-season from local farmers markets. Buy plain frozen fruits and vegetables. If you do not have access to quality fresh seafood, buy precooked frozen shrimp or canned fish, such as tuna, salmon, or sardines. Stock your pantry so you always have certain foods on hand, such as olive oil, canned tuna, canned tomatoes, rice, pasta, and beans. Cooking Cook foods with extra-virgin olive oil instead of using butter or other vegetable oils. Have meat as a side dish, and have vegetables or grains as your main dish. This means having meat in small portions or adding small amounts of meat to foods like pasta or stew. Use beans or vegetables instead of meat in common dishes like chili or lasagna. Experiment with different cooking methods. Try roasting, broiling, steaming, and sauting vegetables. Add frozen vegetables to soups, stews, pasta, or rice. Add nuts or seeds for added healthy fats and plant protein at each meal. You can add these to yogurt, salads, or vegetable dishes. Marinate fish or vegetables using olive oil, lemon juice, garlic, and fresh herbs. Meal planning Plan to eat one vegetarian meal one day each week. Try to work up to two vegetarian meals, if possible. Eat seafood two or more times a week. Have healthy snacks readily available, such as: Vegetable sticks with hummus. Greek yogurt. Fruit and nut trail mix. Eat balanced meals throughout the week. This includes: Fruit: 2-3 servings a day. Vegetables: 4-5 servings a day. Low-fat dairy: 2 servings a day. Fish, poultry, or lean meat: 1 serving a day. Beans and legumes: 2 or more servings a week. Nuts and seeds: 1-2 servings a day. Whole grains: 6-8 servings a day. Extra-virgin olive oil: 3-4 servings a day. Limit red meat and sweets to only a few servings a month. Lifestyle  Cook and eat meals together with your  family, when possible. Drink enough fluid to keep your urine pale yellow. Be physically active every day. This includes: Aerobic exercise like running or swimming. Leisure activities like gardening, walking, or housework. Get 7-8 hours of sleep each night. If recommended by your health care provider, drink red wine in moderation. This means 1 glass a day for nonpregnant women and 2 glasses a day for men. A glass of wine equals 5 oz (150 mL). What foods should I eat? Fruits Apples. Apricots. Avocado. Berries. Bananas. Cherries. Dates. Figs. Grapes. Lemons. Melon. Oranges. Peaches. Plums. Pomegranate. Vegetables Artichokes. Beets. Broccoli. Cabbage. Carrots. Eggplant. Green beans. Chard. Kale. Spinach. Onions. Leeks. Peas. Squash. Tomatoes. Peppers. Radishes. Grains Whole-grain pasta. Brown rice. Bulgur wheat. Polenta. Couscous. Whole-wheat bread. [place]. Meats and other proteins Beans. Almonds. Sunflower seeds. Pine nuts. Peanuts. [place]. Salmon. Scallops. Shrimp. [place]. Tilapia. Clams. Oysters. Eggs. Poultry without skin. Dairy Low-fat milk. Cheese. Greek yogurt. Fats and oils Extra-virgin olive oil. Avocado oil. Grapeseed oil. Beverages Water. Red wine. Herbal tea. Sweets and desserts Greek yogurt with honey. Baked apples. Poached pears. Trail mix. Seasonings and condiments Basil. Cilantro. Coriander. Cumin. Mint. Parsley. Sage.  Rosemary. Tarragon. Garlic. Oregano. Thyme. Pepper. Balsamic vinegar. Tahini. Hummus. Tomato sauce. Olives. Mushrooms. The items listed above may not be a complete list of foods and beverages you can eat. Contact a dietitian for more information. What foods should I limit? This is a list of foods that should be eaten rarely or only on special occasions. Fruits Fruit canned in syrup. Vegetables Deep-fried potatoes (french fries). Grains Prepackaged pasta or rice dishes. Prepackaged cereal with added sugar. Prepackaged snacks with added sugar. Meats and  other proteins Beef. Pork. Lamb. Poultry with skin. Hot dogs. Berniece Salines. Dairy Ice cream. Sour cream. Whole milk. Fats and oils Butter. Canola oil. Vegetable oil. Beef fat (tallow). Lard. Beverages Juice. Sugar-sweetened soft drinks. Beer. Liquor and spirits. Sweets and desserts Cookies. Cakes. Pies. Candy. Seasonings and condiments Mayonnaise. Pre-made sauces and marinades. The items listed above may not be a complete list of foods and beverages you should limit. Contact a dietitian for more information. Summary The Mediterranean diet includes both food and lifestyle choices. Eat a variety of fresh fruits and vegetables, beans, nuts, seeds, and whole grains. Limit the amount of red meat and sweets that you eat. If recommended by your health care provider, drink red wine in moderation. This means 1 glass a day for nonpregnant women and 2 glasses a day for men. A glass of wine equals 5 oz (150 mL). This information is not intended to replace advice given to you by your health care provider. Make sure you discuss any questions you have with your health care provider. Document Revised: 01/20/2020 Document Reviewed: 11/17/2019 Elsevier Patient Education  [place].

## 2022-07-24 ENCOUNTER — Encounter: Payer: Self-pay | Admitting: Advanced Practice Midwife

## 2022-07-24 DIAGNOSIS — Z6841 Body Mass Index (BMI) 40.0 and over, adult: Secondary | ICD-10-CM | POA: Insufficient documentation

## 2022-07-24 NOTE — Progress Notes (Signed)
Patient ID: KRISALYN YANKOWSKI, female   DOB: 05/15/1996, 26 y.o.   MRN: [id number]  Reason for Consult: Consult (Consult for PCOS. Pt not sure of LMP )   Subjective:  Date of Service: 07/23/2022  HPI:  ROSANN Klein is a 26 y.o. female being seen for concerns for pigment changes and PCOS workup. She has noticed darkening of the skin around the base of her neck for the past 1-2 years. Her mother was concerned as she thinks it can be related to PCOS. She denies having menstrual periods unless she is taking birth control pills. She has taken pills for the past year and a half except for the past few months. She occasionally takes a pregnancy test since she doesn't have periods. She admits her diet is primarily fast food, carbohydrate and soda heavy. She denies any other known health concerns.  Past Medical History:  Diagnosis Date   Amenorrhea    BMI 45.0-49.9, adult (HCC)    Family History  Problem Relation Age of Onset   Crohn's disease Brother    Ovarian cancer Maternal Grandmother    Ovarian cancer Maternal Great-grandmother    Past Surgical History:  Procedure Laterality Date   TONSILLECTOMY AND ADENOIDECTOMY      Short Social History:  Social History   Tobacco Use   Smoking status: Never   Smokeless tobacco: Never  Substance Use Topics   Alcohol use: Yes    Allergies  Allergen Reactions   Other Swelling    Beef and pork    Current Outpatient Medications  Medication Sig Dispense Refill   Norethindrone Acetate-Ethinyl Estrad-FE (MICROGESTIN 24 FE) 1-20 MG-MCG(24) tablet Take 1 tablet by mouth daily. 84 tablet 3   medroxyPROGESTERone (PROVERA) 10 MG tablet Take 1 tablet (10 mg total) by mouth daily for 7 days. 7 tablet 0   No current facility-administered medications for this visit.    Review of Systems  Constitutional:  Negative for chills and fever.  HENT:  Negative for congestion, ear discharge, ear pain, hearing loss, sinus pain and sore throat.   Eyes:   Negative for blurred vision and double vision.  Respiratory:  Negative for cough, shortness of breath and wheezing.   Cardiovascular:  Negative for chest pain, palpitations and leg swelling.  Gastrointestinal:  Negative for abdominal pain, blood in stool, constipation, diarrhea, heartburn, melena, nausea and vomiting.  Genitourinary:  Negative for dysuria, flank pain, frequency, hematuria and urgency.  Musculoskeletal:  Negative for back pain, joint pain and myalgias.  Skin:  Negative for itching and rash.       Positive for darkening of skin around neck  Neurological:  Negative for dizziness, tingling, tremors, sensory change, speech change, focal weakness, seizures, loss of consciousness, weakness and headaches.  Endo/Heme/Allergies:  Negative for environmental allergies. Does not bruise/bleed easily.  Psychiatric/Behavioral:  Negative for depression, hallucinations, memory loss, substance abuse and suicidal ideas. The patient is not nervous/anxious and does not have insomnia.         Objective:  Objective   Vitals:   07/23/22 1054  BP: 126/84  Weight: (!) 310 lb (140.6 kg)  Height: 5\' 4"  (1.626 m)   Body mass index is 53.21 kg/m. Constitutional: morbidly obese female in no acute distress.  HEENT: normal Skin: Warm and dry.    Respiratory:  Normal respiratory effort Neuro: DTRs 2+, Cranial nerves grossly intact Psych: Alert and Oriented x3. No memory deficits. Normal mood and affect.   Time spent caring for patient greater  than 50% in consult: 20 minutes Assessment/Plan:     26 y.o. G0 P0 female with acanthosis nigricans  PCOS panel Hgb A1C Insulin random Follow up as needed after labs result Patient education materials provided   Tresea Mall CNM [facility]  [facility] 07/24/2022, 5:32 PM

## 2022-07-29 LAB — TSH+PRL+FSH+TESTT+LH+DHEA S...
17-Hydroxyprogesterone: 83 ng/dL
Androstenedione: 216 ng/dL (ref 41–262)
DHEA-SO4: 120 ug/dL (ref 110.0–431.7)
FSH: 3.3 m[IU]/mL
LH: 6.4 m[IU]/mL
Prolactin: 24.5 ng/mL — ABNORMAL HIGH (ref 4.8–23.3)
TSH: 2.76 u[IU]/mL (ref 0.450–4.500)
Testosterone, Free: 1.6 pg/mL (ref 0.0–4.2)
Testosterone: 85 ng/dL — ABNORMAL HIGH (ref 13–71)

## 2022-07-29 LAB — INSULIN, RANDOM: INSULIN: 106 u[IU]/mL — ABNORMAL HIGH (ref 2.6–24.9)

## 2022-07-29 LAB — HGB A1C W/O EAG: Hgb A1c MFr Bld: 5.9 % — ABNORMAL HIGH (ref 4.8–5.6)

## 2024-03-22 ENCOUNTER — Other Ambulatory Visit: Payer: Self-pay

## 2024-03-22 ENCOUNTER — Encounter: Payer: Self-pay | Admitting: *Deleted

## 2024-03-22 DIAGNOSIS — L0501 Pilonidal cyst with abscess: Secondary | ICD-10-CM | POA: Diagnosis present

## 2024-03-22 NOTE — ED Triage Notes (Addendum)
 Pt ambulatory to triage.  Pt has an abscess right buttock   Pt reports drainage.  Sx for 1 week.  Pt alert.   Pt was seen at urgent care and was started on abx

## 2024-03-23 ENCOUNTER — Emergency Department
Admission: EM | Admit: 2024-03-23 | Discharge: 2024-03-23 | Disposition: A | Discharge status: Home / Self Care | Attending: Emergency Medicine | Admitting: Emergency Medicine

## 2024-03-23 DIAGNOSIS — L0501 Pilonidal cyst with abscess: Secondary | ICD-10-CM

## 2024-03-23 MED ORDER — LIDOCAINE HCL (PF) 1 % IJ SOLN
10.0000 mL | Freq: Once | INTRAMUSCULAR | Status: AC
  Administered 2024-03-23: 10 mL
  Filled 2024-03-23: qty 10

## 2024-03-23 NOTE — Discharge Instructions (Signed)
 Continue the antibiotic and finish the full course.  Follow-up with the surgeon as previously instructed.  Return to the ER for new, worsening, or persistent severe pain, swelling, bleeding, rash spreading around the cyst, fever, or any other new or worsening symptoms that concern you.

## 2024-03-23 NOTE — ED Provider Notes (Signed)
 [facility] Provider Note    Event Date/Time   First MD Initiated Contact with Patient 03/23/24 0300     (approximate)   History   Abscess   HPI  Vanessa Klein is a 28 y.o. female with no significant PMH who presents with drainage from pilonidal cyst that was diagnosed a few days ago.  The patient states that she was started on antibiotic and given her referral to surgery.  She states that today the cyst popped, and there has been pus and blood coming out of it.  She denies any fever or chills.  She states that she is feeling fine otherwise.  I reviewed the past medical records.  The patient was seen at the [facility] urgent care on 3/23 for this symptom.  She was diagnosed with a pilonidal cyst.  She was started on Bactrim.  Physical Exam   Triage Vital Signs: ED Triage Vitals [03/22/24 2210]  Encounter Vitals Group     BP 112/81     Systolic BP Percentile      Diastolic BP Percentile      Pulse Rate (!) 117     Resp 20     Temp 99.1 F (37.3 C)     Temp Source Oral     SpO2 96 %     Weight (!) 331 lb (150.1 kg)     Height 5\' 4"  (1.626 m)     Head Circumference      Peak Flow      Pain Score 5     Pain Loc      Pain Education      Exclude from Growth Chart     Most recent vital signs: Vitals:   03/23/24 0302 03/23/24 0302  BP:  135/64  Pulse:  94  Resp:  18  Temp: 98.2 F (36.8 C) 98.2 F (36.8 C)  SpO2:  95%     General: Awake, no distress.  CV:  Good peripheral perfusion.  Resp:  Normal effort.  Abd:  No distention.  Other:  Right upper buttock with approximately 3 x 5 cm fluctuant mass with purulent drainage.  No significant surrounding erythema or induration.   ED Results / Procedures / Treatments   Labs (all labs ordered are listed, but only abnormal results are displayed) Labs Reviewed - No data to display   EKG    RADIOLOGY    PROCEDURES:  Critical Care performed: No  .Incision and Drainage  Date/Time:  03/23/2024 4:01 AM  Performed by: Dionne Bucy, MD Authorized by: Dionne Bucy, MD   Consent:    Consent obtained:  Verbal   Consent given by:  Patient   Risks discussed:  Bleeding, infection, incomplete drainage and pain   Alternatives discussed:  Alternative treatment, delayed treatment and observation Universal protocol:    Patient identity confirmed:  Verbally with patient Location:    Type:  Pilonidal cyst   Size:  5cm   Location: Upper buttock. Anesthesia:    Anesthesia method:  Local infiltration   Local anesthetic:  Lidocaine 1% w/o epi Procedure type:    Complexity:  Simple Procedure details:    Incision types:  Single straight   Drainage:  Purulent   Drainage amount:  Copious   Wound treatment:  Wound left open   Packing materials:  None Post-procedure details:    Procedure completion:  Tolerated well, no immediate complications    MEDICATIONS ORDERED IN ED: Medications  lidocaine (PF) (XYLOCAINE) 1 %  injection 10 mL (10 mLs Other Given by Other 03/23/24 0319)     IMPRESSION / MDM / ASSESSMENT AND PLAN / ED COURSE  I reviewed the triage vital signs and the nursing notes.  Differential diagnosis includes, but is not limited to, pilonidal cyst, abscess.  There is no significant cellulitis.  Although the abscess spontaneously drained, the opening is quite small.  I recommended that we perform an I&D to open up a little bit more and help with drain.  The patient is in agreement with this plan.  Patient's presentation is most consistent with acute, uncomplicated illness.  ----------------------------------------- 4:02 AM on 03/23/2024 -----------------------------------------  I&D performed successfully.  There was a copious amount of purulent drainage.  The patient tolerated the procedure well.  She is stable for discharge home at this time.  I have instructed her to finish the antibiotic course, and follow-up with the general surgeon as planned.  I  gave strict return precautions and she expressed understanding.   FINAL CLINICAL IMPRESSION(S) / ED DIAGNOSES   Final diagnoses:  Pilonidal abscess     Rx / DC Orders   ED Discharge Orders     None        Note:  This document was prepared using Dragon voice recognition software and may include unintentional dictation errors.    Dionne Bucy, MD 03/23/24 [phone]

## 2024-03-28 ENCOUNTER — Encounter: Payer: Self-pay | Admitting: Surgery

## 2024-03-28 ENCOUNTER — Ambulatory Visit (INDEPENDENT_AMBULATORY_CARE_PROVIDER_SITE_OTHER): Admitting: Surgery

## 2024-03-28 VITALS — BP 130/82 | HR 73 | Temp 99.2°F | Ht 64.0 in | Wt 324.0 lb

## 2024-03-28 DIAGNOSIS — L0501 Pilonidal cyst with abscess: Secondary | ICD-10-CM

## 2024-03-28 NOTE — Progress Notes (Signed)
 03/28/2024  Reason for Visit:  Pilonidal cyst with abscess  History of Present Illness: Vanessa Klein is a 28 y.o. female presenting for evaluation of a pilonidal cyst with abscess.  The patient reports this has been ongoing for about 2 weeks.  She presented to Urgent Care on 03/20/24 and was diagnosed with a pilonidal cyst infection and was started on Bactrim.  This continued to worsen and started spontaneously draining.  She presented to the ED on 03/23/24 and had a bedside I&D done.  No packing was needed and she reports the area is healing.  Denies any further pain at this point.  Denies any fevers, chills.  This is the first episode and had not had any prior issues in the past.  Past Medical History: Past Medical History:  Diagnosis Date   Amenorrhea    BMI 45.0-49.9, adult [facility])      Past Surgical History: Past Surgical History:  Procedure Laterality Date   TONSILLECTOMY AND ADENOIDECTOMY      Home Medications: Prior to Admission medications   Not on File    Allergies: Allergies  Allergen Reactions   Other Swelling    Beef and pork    Social History:  reports that she has never smoked. She has never used smokeless tobacco. She reports current alcohol use. She reports that she does not use drugs.   Family History: Family History  Problem Relation Age of Onset   Crohn's disease Brother    Ovarian cancer Maternal Grandmother    Ovarian cancer Maternal Great-grandmother     Review of Systems: Review of Systems  Constitutional:  Negative for chills and fever.  Respiratory:  Negative for shortness of breath.   Cardiovascular:  Negative for chest pain.  Gastrointestinal:  Negative for nausea and vomiting.  Skin:        Gluteal cleft wound and pain    Physical Exam BP 130/82   Pulse 73   Temp 99.2 F (37.3 C) (Oral)   Ht 5\' 4"  (1.626 m)   Wt (!) 324 lb (147 kg)   LMP  (Exact Date)   SpO2 98%   BMI 55.61 kg/m  CONSTITUTIONAL: No acute distress HEENT:   Normocephalic, atraumatic, extraocular motion intact. RESPIRATORY:  Normal respiratory effort without pathologic use of accessory muscles. CARDIOVASCULAR: Regular rhythm and rate. MUSCULOSKELETAL:  Normal muscle strength and tone in all four extremities.  No peripheral edema or cyanosis. SKIN: Patient has in the superior aspect, right medial gluteal cleft a small wound consistent with I&D scar, which is healing, with minimal drainage.  There is no erythema but there is still some induration.  There is one very small pilonidal sinus/pit in the gluteal cleft about 1 cm inferior to the I&D site.  NEUROLOGIC:  Motor and sensation is grossly normal.  Cranial nerves are grossly intact. PSYCH:  Alert and oriented to person, place and time. Affect is normal.  Laboratory Analysis: No results found for this or any previous visit (from the past 24 hours).  Imaging: No results found.  Assessment and Plan: This is a 28 y.o. female with a pilonidal cyst with abscess, s/p I&D.  --Discussed with the patient that this could be a pilonidal cyst given the location and sinus/pit in the gluteal cleft.  Discussed with her how these cysts develop and grow and could become infected.  After I&D, this has improved and her pain has resolved and the wound is healing.  Discussed with her that she would be at risk  of this episode happening in the future, but right now there's no particular urgency to proceed with any excisions.   --Patient will follow up with me in about 10 days to reassess her wound.  We can discuss further then whether to proceed with excision of the pilonidal cyst vs watchful waiting. --All of her questions have been answered.  I spent 30 minutes dedicated to the care of this patient on the date of this encounter to include pre-visit review of records, face-to-face time with the patient discussing diagnosis and management, and any post-visit coordination of care.   Howie Ill, MD [facility]

## 2024-03-28 NOTE — Patient Instructions (Signed)
 Pilonidal Cyst    A pilonidal cyst is a fluid-filled sac that forms under the skin near the tailbone, at the top of the crease of the buttocks (pilonidal area). Cysts that are small and not infected may not cause any problems.  Cysts that become irritated or infected may grow and fill with pus. An infected cyst is called an abscess. A pilonidal abscess may cause pain and swelling. It may need to be drained or removed.  What are the causes?  The cause of this condition is not always known. In some cases, it may be caused by a hair that grows into your skin (ingrown hair).  What increases the risk?  You are more likely to develop this condition if:  You are female.  You have lots of hair near the crease of the buttocks.  You are overweight.  You have a dimple near the crease of the buttocks.  You wear tight clothing.  You do not bathe or shower often.  You sit for long periods of time.  What are the signs or symptoms?  Symptoms of this condition may include pain, swelling, redness, and warmth in the pilonidal area. You may also be able to feel a lump near your tailbone if your cyst is big.  If your cyst becomes infected, symptoms may include:  Pus or fluid drainage.  Fever.  Pain, swelling, and redness getting worse.  The lump getting bigger.  How is this diagnosed?  This condition may be diagnosed based on:  Your symptoms and medical history.  A physical exam.  A blood test to check for infection.  A test of a pus sample.  How is this treated?  You may not need any treatment if your cyst does not cause symptoms. If your cyst bothers you or is infected, you may need a procedure to drain or remove the cyst. Depending on the size, location, and severity of your cyst, your health care provider may:  Make an incision in the cyst and drain it (incision anddrainage).  Open and drain the cyst, and then stitch the wound so that it stays open while it heals (marsupialization). You will be given instructions about how to care for  your open wound while it heals.  Remove all or part of the cyst, and then close the wound (cyst removal).  You may need to take antibiotics before your procedure.  Follow these instructions at home:  Medicines  Take over-the-counter and prescription medicines only as told by your health care provider.  If you were prescribed antibiotics, take them as told by your health care provider. Do not stop taking the antibiotic even if you start to feel better.  General instructions  Keep the area around the cyst clean and dry.  If there is fluid or pus draining from your cyst:  Cover the area with a clean bandage (dressing).  Wash the area gently with soap and water. Pat the area dry with a clean towel. Do not rub the area because that may cause bleeding.  Remove hair from the area around the cyst only if your health care provider tells you to.  Do not wear tight pants or sit in one position for long periods at a time.  Contact a health care provider if:  You have new redness, swelling, or pain.  You have a fever.  You have severe pain.  Summary  A pilonidal cyst is a fluid-filled sac that forms under the skin near the tailbone, at  the top of the crease of the buttocks (pilonidal area).  Cysts that become irritated or infected may grow and fill with pus. An infected cyst is called an abscess.  The cause of this condition is not always known. In some cases, it may be caused by a hair that grows into your skin (ingrown hair).  You may not need any treatment if your cyst does not cause symptoms. If your cyst bothers you or is infected, you may need a procedure to drain or remove the cyst.  This information is not intended to replace advice given to you by your health care provider. Make sure you discuss any questions you have with your health care provider.  Document Revised: 03/12/2022 Document Reviewed: 03/12/2022  Elsevier Patient Education  2024 [employer].

## 2024-04-08 ENCOUNTER — Encounter: Payer: Self-pay | Admitting: Surgery

## 2024-04-08 ENCOUNTER — Ambulatory Visit (INDEPENDENT_AMBULATORY_CARE_PROVIDER_SITE_OTHER): Admitting: Surgery

## 2024-04-08 VITALS — BP 126/80 | HR 75 | Temp 98.2°F | Ht 64.0 in | Wt 326.8 lb

## 2024-04-08 DIAGNOSIS — Z09 Encounter for follow-up examination after completed treatment for conditions other than malignant neoplasm: Secondary | ICD-10-CM | POA: Diagnosis not present

## 2024-04-08 DIAGNOSIS — L0501 Pilonidal cyst with abscess: Secondary | ICD-10-CM

## 2024-04-08 NOTE — Patient Instructions (Addendum)
 You have been seen today for a Pilonidal Cyst.  To keep this cyst as minimal as possible, you will want to shave or use Veet (Hair Remover) on this area to keep as much hair out of the tracts as you can, have a family member pull hair from the tracts if possible, keep the area clean and dry as possible. Wash with soap and water once daily and keep a guaze on this area at all times while draining.  If we excise this area (surgery) in the future, you will need to arrange to be out of work for approximately 1-2 weeks and then have a family member change the dressing 1-2 times daily until this heals from the inside out.   Please call our office with any questions or concerns.    Pilonidal Cyst A pilonidal cyst is a fluid-filled sac. It forms beneath the skin near your tailbone, at the top of the crease of your buttocks. A pilonidal cyst that is not large or infected may not cause symptoms or problems. If the cyst becomes irritated or infected, it may fill with pus. This causes pain and swelling (pilonidal abscess). An infected cyst may need to be treated with medicine, drained, or removed. CAUSES The cause of a pilonidal cyst is not known. One cause may be a hair that grows into your skin (ingrown hair). RISK FACTORS Pilonidal cysts are more common in boys and men. Risk factors include:  Having lots of hair near the crease of the buttocks.  Being overweight.  Having a pilonidal dimple.  Wearing tight clothing.  Not bathing or showering frequently.  Sitting for long periods of time. SIGNS AND SYMPTOMS Signs and symptoms of a pilonidal cyst may include:  Redness.  Pain and tenderness.  Warmth.  Swelling.  Pus.  Fever. DIAGNOSIS Your health care provider may diagnose a pilonidal cyst based on your symptoms and a physical exam. The health care provider may do a blood test to check for infection. If your cyst is draining pus, your health care provider may take a sample of the  drainage to be tested at a laboratory. TREATMENT Surgery is the usual treatment for an infected pilonidal cyst. You may also have to take medicines before surgery. The type of surgery you have depends on the size and severity of the infected cyst. The different kinds of surgery include:  Incision and drainage. This is a procedure to open and drain the cyst.  Marsupialization. In this procedure, a large cyst or abscess may be opened and kept open by stitching the edges of the skin to the cyst walls.  Cyst removal. This procedure involves opening the skin and removing all or part of the cyst. HOME CARE INSTRUCTIONS  Follow all of your surgeon's instructions carefully if you had surgery.  Take medicines only as directed by your health care provider.  If you were prescribed an antibiotic medicine, finish it all even if you start to feel better.  Keep the area around your pilonidal cyst clean and dry.  Clean the area as directed by your health care provider. Pat the area dry with a clean towel. Do not rub it as this may cause bleeding.  Remove hair from the area around the cyst as directed by your health care provider.  Do not wear tight clothing or sit in one place for long periods of time.  There are many different ways to close and cover an incision, including stitches, skin glue, and adhesive strips. Follow  your health care provider's instructions on:  Incision care.  Bandage (dressing) changes and removal.  Incision closure removal. SEEK MEDICAL CARE IF:   You have drainage, redness, swelling, or pain at the site of the cyst.  You have a fever.   This information is not intended to replace advice given to you by your health care provider. Make sure you discuss any questions you have with your health care provider.   Document Released: 12/12/2000 Document Revised: 01/05/2015 Document Reviewed: 05/04/2014 Elsevier Interactive Patient Education [employer]

## 2024-04-08 NOTE — Progress Notes (Signed)
  04/08/2024  History of Present Illness: Vanessa Klein is a 28 y.o. female presenting for follow up of an infected pilonidal cyst.  She had an I&D of the site on 03/23/24 in the ED.  Today, she reports that the wound is fully healed.  She denies any pain in the area, redness, drainage.  Denies any other issues at this point.  Past Medical History: Past Medical History:  Diagnosis Date   Amenorrhea    BMI 45.0-49.9, adult [facility])      Past Surgical History: Past Surgical History:  Procedure Laterality Date   TONSILLECTOMY AND ADENOIDECTOMY      Home Medications: Prior to Admission medications   Not on File    Allergies: Allergies  Allergen Reactions   Other Swelling    Beef and pork    Review of Systems: Review of Systems  Constitutional:  Negative for chills and fever.  Respiratory:  Negative for shortness of breath.   Cardiovascular:  Negative for chest pain.  Skin:  Negative for itching and rash.    Physical Exam BP 126/80   Pulse 75   Temp 98.2 F (36.8 C) (Oral)   Ht 5\' 4"  (1.626 m)   Wt (!) 326 lb 12.8 oz (148.2 kg)   LMP  (Exact Date)   SpO2 97%   BMI 56.10 kg/m  CONSTITUTIONAL: No acute distress HEENT:  Normocephalic, atraumatic, extraocular motion intact. RESPIRATORY:  Normal respiratory effort without pathologic use of accessory muscles. CARDIOVASCULAR: Regular rhythm and rate SKIN:  Gluteal cleft has one sinus pit without any evidence of infection or drainage.  The I&D site in the medial right buttocks is fully healed, without any erythema or induration or tenderness. NEUROLOGIC:  Motor and sensation is grossly normal.  Cranial nerves are grossly intact. PSYCH:  Alert and oriented to person, place and time. Affect is normal.   Assessment and Plan: This is a 28 y.o. female s/p I&D of infected pilonidal cyst.  --The patient's wound is fully healed. No significant inflammation left in the area.  She is doing well and denies any symptoms.  Discussed  with her the option for excision of the pilonidal cyst to prevent further recurrences.  She would like to proceed with surgery, but wants to wait for a few months as the upcoming months will be busy with work and travel.  Discussed with her briefly the surgery itself including the planned incision, risks, that this would be an outpatient surgery, pain control, and activity restrictions. --Patient will follow up with me towards the end of July to early August to schedule surgery. --Return precautions given.  All of her questions have been answered.  I spent 20 minutes dedicated to the care of this patient on the date of this encounter to include pre-visit review of records, face-to-face time with the patient discussing diagnosis and management, and any post-visit coordination of care.   Howie Ill, MD [facility]

## 2024-07-11 ENCOUNTER — Encounter: Payer: Self-pay | Admitting: Surgery

## 2024-07-11 ENCOUNTER — Ambulatory Visit: Admitting: Surgery

## 2024-07-11 ENCOUNTER — Telehealth: Payer: Self-pay | Admitting: Surgery

## 2024-07-11 VITALS — BP 133/85 | HR 79 | Temp 98.4°F | Ht 64.0 in | Wt 321.0 lb

## 2024-07-11 DIAGNOSIS — L0501 Pilonidal cyst with abscess: Secondary | ICD-10-CM | POA: Diagnosis not present

## 2024-07-11 NOTE — H&P (View-Only) (Signed)
  07/11/2024  History of Present Illness: Vanessa Klein is a 28 y.o. female presenting for follow-up of pilonidal cyst.  The patient was last seen on 04/08/2024 at which time her abdominal I&D site had fully healed.  The patient wanted to wait until the summertime in order to do her surgery.  She presents today to schedule surgery.  She reports that the pilonidal cyst area has been doing well and denies any recent flareups.  Has not needed any other procedures or antibiotics.  Currently denies any pain, fevers, chills.  Past Medical History: Past Medical History:  Diagnosis Date   Amenorrhea    BMI 45.0-49.9, adult [facility])      Past Surgical History: Past Surgical History:  Procedure Laterality Date   TONSILLECTOMY AND ADENOIDECTOMY      Home Medications: Prior to Admission medications   Not on File    Allergies: Allergies  Allergen Reactions   Other Swelling    Beef and pork    Review of Systems: Review of Systems  Constitutional:  Negative for chills and fever.  Respiratory:  Negative for shortness of breath.   Cardiovascular:  Negative for chest pain.  Gastrointestinal:  Negative for nausea and vomiting.  Skin:  Negative for rash.    Physical Exam BP 133/85   Pulse 79   Temp 98.4 F (36.9 C) (Oral)   Ht 5' 4 (1.626 m)   Wt (!) 321 lb (145.6 kg)   SpO2 98%   BMI 55.10 kg/m  CONSTITUTIONAL: No acute distress HEENT:  Normocephalic, atraumatic, extraocular motion intact. RESPIRATORY:  Lungs are clear, and breath sounds are equal bilaterally. Normal respiratory effort without pathologic use of accessory muscles. CARDIOVASCULAR: Heart is regular without murmurs, gallops, or rubs. SKIN: The patient has healed scar in the right medial aspect of the superior portion of the gluteal cleft consistent with prior I&D without any erythema or induration.  No further sinus pits except 1 about 1 cm inferior to this area.  No new abscess or evidence of infection. NEUROLOGIC:   Motor and sensation is grossly normal.  Cranial nerves are grossly intact. PSYCH:  Alert and oriented to person, place and time. Affect is normal.  Assessment and Plan: This is a 28 y.o. female with a pilonidal cyst.  - Patient has been doing well over the last 3 months since her last visit with me without any flareups from her pilonidal cyst.  She would like to proceed with surgery to prevent further recurrences. - Discussed with her that the plan for a pilonidal cyst excision I reviewed the surgery at length with her including the planned incision, risks of bleeding, infection, injury to surrounding structures, that this would be an outpatient procedure, postoperative pain control, activity restrictions, and she is willing to proceed. - Will schedule patient for surgery on 07/26/2024.  All of her questions have been answered.  I spent 20 minutes dedicated to the care of this patient on the date of this encounter to include pre-visit review of records, face-to-face time with the patient discussing diagnosis and management, and any post-visit coordination of care.   Aloysius Sheree Plant, MD [facility]

## 2024-07-11 NOTE — Patient Instructions (Signed)
 You have been seen today for a Pilonidal Cyst. You have requested to have your pilonidal cyst excised. This will be done at [facility] with Dr. Desiderio.  If you are on any injectable weight loss medication, you will need to stop taking your GLP-1 injectable (weight loss) medications 8 days before your surgery to avoid any complications with anesthesia.   You will need to arrange to be out of work for approximately 1-2 weeks and then have a family member change the dressing 1-2 times daily until this heals from the inside out.   If you have FMLA or disability paperwork that needs filled out you may drop this off at our office or this can be faxed to [phone].  Please see the (blue)pre-care form that you have been given today. Our surgery scheduler will call you to verify surgery date and to go over information.   If you have any questions, please call our office.   Pilonidal Cyst Removal Pilonidal cyst removal is a procedure to remove a fluid-filled sac (cyst) that forms under the skin near the tailbone, at the top of the crease between the buttocks (pilonidal area). This procedure is also called a pilonidal cystectomy.  Pilonidal cyst is caused by an ingrown hair that irritates the area. Sometimes a tunnel (sinus) forms under the skin from the cyst and makes a second opening in the skin. In that case, the sinus area may also be removed during the procedure. You may need this procedure if you have a cyst that is large, painful, or keeps getting infected. A cyst that becomes infected is called an abscess. The abscess may need to be opened, drained, and treated with antibiotics before the cyst is removed. Tell your health care provider about: Any allergies you have. All medicines you are taking, including vitamins, herbs, eye drops, creams, and over-the-counter medicines. Any problems you or family members have had with anesthetic medicines. Any bleeding problems you have. Any  surgeries you have had. Any medical conditions you have. Whether you are pregnant or may be pregnant. Any recent fever, increase in pain, or discharge from the cyst. What are the risks? Your health care provider will talk with you about risks. These may include: Delay in healing. This is the most common problem. Infection. Bleeding. Allergic reactions to medicines. A closed incision opening. The cyst coming back again (recurrence). What happens before the procedure? Medicines Ask your health care provider about: Changing or stopping your regular medicines. These include any diabetes medicines or blood thinners you take. Taking medicines such as aspirin and ibuprofen. These medicines can thin your blood. Do not take them unless your health care provider tells you to. Taking over-the-counter medicines, vitamins, herbs, and supplements. Surgery safety Ask your health care provider: How your surgery site will be marked. What steps will be taken to help prevent infection. These steps may include: Removing hair at the surgery site. Washing skin with a soap that kills germs. Taking antibiotics. General instructions Do not use any products that contain nicotine or tobacco for at least 4 weeks before the procedure. These products include cigarettes, chewing tobacco, and vaping devices, such as e-cigarettes. If you need help quitting, ask your health care provider. If you will be going home right after the procedure, plan to have a responsible adult: Take you home from the hospital or clinic. You will not be allowed to drive. Care for you for the time you are told. You may need help with wound care  and dressing changes. What happens during the procedure?  An IV will be inserted into one of your veins. You may be given: A sedative. This helps you relax. Anesthesia. This will: Numb certain areas of your body. Make you fall asleep for surgery. Your surgeon will make an incision near the  cyst. Depending on the size of the cyst and if the sinus is infected, one of the following will be done: If there is an abscess, a small hole will be made in the cyst. The pus will be drained out. If the sinus is large or keeps getting infected, your surgeon may: Cut out the sinus and remove some of the skin around it. The wound will be left open to heal on its own. Remove the sinus and cut out a flap on either side of it. The two sides will be stitched together. A thin, flexible tube with a camera (endoscope) may be used before this procedure to better see the area. The surgeon may remove hair and infected tissue. The sinus will then be cleaned with a solution. Heat will be used to seal the sinus. The incision may be left open or closed. An open incision may be packed with gauze and covered with a bandage (dressing). An incision may be closed with stitches (sutures) and covered with a dressing. The area may be sealed with fibrin glue and covered with a dressing. The procedure may vary among health care providers and hospitals. What happens after the procedure? Your blood pressure, heart rate, breathing rate, and blood oxygen level will be monitored until you leave the hospital or clinic. You will be given medicine for pain as needed. If you were given a sedative during the procedure, it can affect you for several hours. Do not drive or operate machinery until your health care provider says that it is safe. Your health care provider will give you instructions for taking care of your dressing at home after the procedure. If your incision was left open and packed with gauze, you will need to change your dressing every day. Summary Pilonidal cyst removal is surgery to remove a fluid-filled sac (cyst) that forms in the crease between the buttocks. The incision used to remove the cyst may be closed with sutures or left open. If left open, it may be packed with gauze and covered with a dressing. You  will be given medicine for pain as needed. Your health care provider will give you instructions for taking care of your dressing at home. This information is not intended to replace advice given to you by your health care provider. Make sure you discuss any questions you have with your health care provider. Document Revised: 03/21/2022 Document Reviewed: 03/21/2022 Elsevier Patient Education  2024 [employer].

## 2024-07-11 NOTE — Telephone Encounter (Signed)
 Patient has been advised of Pre-Admission date/time, and Surgery date at [facility].  Surgery Date: 07/26/24 Preadmission Testing Date: 07/18/24 (phone 8a-1p)  Patient informed of the scheduling process and surgery information given at time of office visit.  Patient has been made aware to call [phone], between 1-3:00pm the day before surgery, to find out what time to arrive for surgery.

## 2024-07-11 NOTE — Progress Notes (Signed)
  07/11/2024  History of Present Illness: Vanessa Klein is a 28 y.o. female presenting for follow-up of pilonidal cyst.  The patient was last seen on 04/08/2024 at which time her abdominal I&D site had fully healed.  The patient wanted to wait until the summertime in order to do her surgery.  She presents today to schedule surgery.  She reports that the pilonidal cyst area has been doing well and denies any recent flareups.  Has not needed any other procedures or antibiotics.  Currently denies any pain, fevers, chills.  Past Medical History: Past Medical History:  Diagnosis Date   Amenorrhea    BMI 45.0-49.9, adult [facility])      Past Surgical History: Past Surgical History:  Procedure Laterality Date   TONSILLECTOMY AND ADENOIDECTOMY      Home Medications: Prior to Admission medications   Not on File    Allergies: Allergies  Allergen Reactions   Other Swelling    Beef and pork    Review of Systems: Review of Systems  Constitutional:  Negative for chills and fever.  Respiratory:  Negative for shortness of breath.   Cardiovascular:  Negative for chest pain.  Gastrointestinal:  Negative for nausea and vomiting.  Skin:  Negative for rash.    Physical Exam BP 133/85   Pulse 79   Temp 98.4 F (36.9 C) (Oral)   Ht 5' 4 (1.626 m)   Wt (!) 321 lb (145.6 kg)   SpO2 98%   BMI 55.10 kg/m  CONSTITUTIONAL: No acute distress HEENT:  Normocephalic, atraumatic, extraocular motion intact. RESPIRATORY:  Lungs are clear, and breath sounds are equal bilaterally. Normal respiratory effort without pathologic use of accessory muscles. CARDIOVASCULAR: Heart is regular without murmurs, gallops, or rubs. SKIN: The patient has healed scar in the right medial aspect of the superior portion of the gluteal cleft consistent with prior I&D without any erythema or induration.  No further sinus pits except 1 about 1 cm inferior to this area.  No new abscess or evidence of infection. NEUROLOGIC:   Motor and sensation is grossly normal.  Cranial nerves are grossly intact. PSYCH:  Alert and oriented to person, place and time. Affect is normal.  Assessment and Plan: This is a 28 y.o. female with a pilonidal cyst.  - Patient has been doing well over the last 3 months since her last visit with me without any flareups from her pilonidal cyst.  She would like to proceed with surgery to prevent further recurrences. - Discussed with her that the plan for a pilonidal cyst excision I reviewed the surgery at length with her including the planned incision, risks of bleeding, infection, injury to surrounding structures, that this would be an outpatient procedure, postoperative pain control, activity restrictions, and she is willing to proceed. - Will schedule patient for surgery on 07/26/2024.  All of her questions have been answered.  I spent 20 minutes dedicated to the care of this patient on the date of this encounter to include pre-visit review of records, face-to-face time with the patient discussing diagnosis and management, and any post-visit coordination of care.   Aloysius Sheree Plant, MD [facility]

## 2024-07-18 ENCOUNTER — Other Ambulatory Visit: Payer: Self-pay

## 2024-07-18 ENCOUNTER — Encounter: Payer: Self-pay | Admitting: Surgery

## 2024-07-18 ENCOUNTER — Encounter
Admission: RE | Admit: 2024-07-18 | Discharge: 2024-07-18 | Disposition: A | Discharge status: Home / Self Care | Source: Ambulatory Visit | Attending: Surgery | Admitting: Surgery

## 2024-07-18 VITALS — Ht 64.0 in | Wt 321.0 lb

## 2024-07-18 DIAGNOSIS — Z01812 Encounter for preprocedural laboratory examination: Secondary | ICD-10-CM

## 2024-07-18 DIAGNOSIS — Z6841 Body Mass Index (BMI) 40.0 and over, adult: Secondary | ICD-10-CM

## 2024-07-18 NOTE — Patient Instructions (Addendum)
 Your procedure is scheduled on:  TUESDAY JULY 29  Report to the Registration Desk on the 1st floor of the [facility] To find out your arrival time, please call [phone] between 1PM - 3PM on:  MONDAY JULY 28  If your arrival time is 6:00 am, do not arrive before that time as the [facility] entrance doors do not open until 6:00 am.  REMEMBER: Instructions that are not followed completely may result in serious medical risk, up to and including death; or upon the discretion of your surgeon and anesthesiologist your surgery may need to be rescheduled.  Do not eat food after midnight the night before surgery.  No gum chewing or hard candies.  You may however, drink CLEAR liquids up to 2 hours before you are scheduled to arrive for your surgery. Do not drink anything within 2 hours of your scheduled arrival time.  Clear liquids include: - water  - apple juice without pulp - gatorade (not RED colors) - black coffee or tea (Do NOT add milk or creamers to the coffee or tea) Do NOT drink anything that is not on this list.  One week prior to surgery:TUESDAY JULY 22  Stop Anti-inflammatories (NSAIDS) such as Advil, Aleve, Ibuprofen, Motrin, Naproxen, Naprosyn and Aspirin based products such as Excedrin, Goody's Powder, BC Powder. Stop ANY OVER THE COUNTER supplements until after surgery.  You may however, continue to take Tylenol if needed for pain up until the day of surgery.  Continue taking all of your other prescription medications up until the day of surgery.  ON THE DAY OF SURGERY DO NOT TAKE ANY MEDICATIONS  No Alcohol for 24 hours before or after surgery.  Do not use any recreational drugs for at least a week (preferably 2 weeks) before your surgery.  Please be advised that the combination of cocaine and anesthesia may have negative outcomes, up to and including death. If you test positive for cocaine, your surgery will be cancelled.  On the morning of surgery brush your  teeth with toothpaste and water, you may rinse your mouth with mouthwash if you wish. Do not swallow any toothpaste or mouthwash.  Do not wear jewelry, make-up, hairpins, clips or nail polish.  For welded (permanent) jewelry: bracelets, anklets, waist bands, etc.  Please have this removed prior to surgery.  If it is not removed, there is a chance that hospital personnel will need to cut it off on the day of surgery.  Do not wear lotions, powders, or perfumes.   Do not shave body hair from the neck down 48 hours before surgery.  Contact lenses, hearing aids and dentures may not be worn into surgery.  Do not bring valuables to the hospital. [facility] is not responsible for any missing/lost belongings or valuables.   Notify your doctor if there is any change in your medical condition (cold, fever, infection).  Wear comfortable clothing (specific to your surgery type) to the hospital.  After surgery, you can help prevent lung complications by doing breathing exercises.   If you are being discharged the day of surgery, you will not be allowed to drive home. You will need a responsible individual to drive you home and stay with you for 24 hours after surgery.   If you are taking public transportation, you will need to have a responsible individual with you.  Please call the Pre-admissions Testing Dept. at [phone] if you have any questions about these instructions.  Surgery Visitation Policy:  Patients  having surgery or a procedure may have two visitors.  Children under the age of 70 must have an adult with them who is not the patient.   Merchandiser, retail to address health-related social needs:  [email or web address]

## 2024-07-21 ENCOUNTER — Encounter
Admission: RE | Admit: 2024-07-21 | Discharge: 2024-07-21 | Disposition: A | Discharge status: Home / Self Care | Source: Ambulatory Visit | Attending: Surgery | Admitting: Surgery

## 2024-07-21 DIAGNOSIS — Z6841 Body Mass Index (BMI) 40.0 and over, adult: Secondary | ICD-10-CM | POA: Diagnosis not present

## 2024-07-21 DIAGNOSIS — Z01812 Encounter for preprocedural laboratory examination: Secondary | ICD-10-CM | POA: Diagnosis present

## 2024-07-21 LAB — BASIC METABOLIC PANEL WITH GFR
Anion gap: 8 (ref 5–15)
BUN: 12 mg/dL (ref 6–20)
CO2: 25 mmol/L (ref 22–32)
Calcium: 8.8 mg/dL — ABNORMAL LOW (ref 8.9–10.3)
Chloride: 104 mmol/L (ref 98–111)
Creatinine, Ser: 0.77 mg/dL (ref 0.44–1.00)
GFR, Estimated: >60 mL/min (ref >60)
Glucose, Bld: 121 mg/dL — ABNORMAL HIGH (ref 70–99)
Potassium: 3.6 mmol/L (ref 3.5–5.1)
Sodium: 137 mmol/L (ref 135–145)

## 2024-07-26 ENCOUNTER — Ambulatory Visit: Payer: Self-pay | Admitting: Urgent Care

## 2024-07-26 ENCOUNTER — Encounter: Payer: Self-pay | Admitting: Surgery

## 2024-07-26 ENCOUNTER — Encounter
Admission: RE | Disposition: A | Discharge status: Home / Self Care | Payer: Self-pay | Source: Home / Self Care | Attending: Surgery

## 2024-07-26 ENCOUNTER — Ambulatory Visit
Admission: RE | Admit: 2024-07-26 | Discharge: 2024-07-26 | Disposition: A | Discharge status: Home / Self Care | Attending: Surgery | Admitting: Surgery

## 2024-07-26 ENCOUNTER — Other Ambulatory Visit: Payer: Self-pay

## 2024-07-26 DIAGNOSIS — Z6841 Body Mass Index (BMI) 40.0 and over, adult: Secondary | ICD-10-CM | POA: Insufficient documentation

## 2024-07-26 DIAGNOSIS — E6689 Other obesity not elsewhere classified: Secondary | ICD-10-CM | POA: Insufficient documentation

## 2024-07-26 DIAGNOSIS — L0591 Pilonidal cyst without abscess: Secondary | ICD-10-CM | POA: Diagnosis not present

## 2024-07-26 DIAGNOSIS — L0501 Pilonidal cyst with abscess: Secondary | ICD-10-CM

## 2024-07-26 HISTORY — DX: Elevated blood-pressure reading, without diagnosis of hypertension: R03.0

## 2024-07-26 HISTORY — DX: Scoliosis, unspecified: M41.9

## 2024-07-26 HISTORY — PX: PILONIDAL CYST EXCISION: SHX744

## 2024-07-26 HISTORY — DX: Obesity, unspecified: E66.9

## 2024-07-26 HISTORY — DX: Prediabetes: R73.03

## 2024-07-26 HISTORY — DX: Pilonidal cyst without abscess: L05.91

## 2024-07-26 LAB — POCT PREGNANCY, URINE: Preg Test, Ur: NEGATIVE

## 2024-07-26 SURGERY — EXCISION, PILONIDAL CYST, EXTENSIVE
Anesthesia: General

## 2024-07-26 MED ORDER — CHLORHEXIDINE GLUCONATE CLOTH 2 % EX PADS
6.0000 | MEDICATED_PAD | Freq: Once | CUTANEOUS | Status: AC
  Administered 2024-07-26: 6 via TOPICAL

## 2024-07-26 MED ORDER — MIDAZOLAM HCL 2 MG/2ML IJ SOLN
INTRAMUSCULAR | Status: DC | PRN
  Administered 2024-07-26: 2 mg via INTRAVENOUS

## 2024-07-26 MED ORDER — PROPOFOL 10 MG/ML IV BOLUS
INTRAVENOUS | Status: AC
  Filled 2024-07-26: qty 20

## 2024-07-26 MED ORDER — CHLORHEXIDINE GLUCONATE 0.12 % MT SOLN
OROMUCOSAL | Status: AC
  Filled 2024-07-26: qty 15

## 2024-07-26 MED ORDER — IBUPROFEN 600 MG PO TABS: 600.0000 mg | ORAL_TABLET | Freq: Three times a day (TID) | ORAL | 1 refills | Status: DC | PRN

## 2024-07-26 MED ORDER — GABAPENTIN 300 MG PO CAPS
ORAL_CAPSULE | ORAL | Status: AC
  Filled 2024-07-26: qty 1

## 2024-07-26 MED ORDER — CHLORHEXIDINE GLUCONATE 0.12 % MT SOLN
15.0000 mL | Freq: Once | OROMUCOSAL | Status: AC
  Administered 2024-07-26: 15 mL via OROMUCOSAL

## 2024-07-26 MED ORDER — SUGAMMADEX SODIUM 200 MG/2ML IV SOLN
INTRAVENOUS | Status: DC | PRN
Start: 2024-07-26 — End: 2024-07-26
  Administered 2024-07-26: 200 mg via INTRAVENOUS

## 2024-07-26 MED ORDER — 0.9 % SODIUM CHLORIDE (POUR BTL) OPTIME
TOPICAL | Status: DC | PRN
  Administered 2024-07-26: 500 mL

## 2024-07-26 MED ORDER — OXYCODONE HCL 5 MG/5ML PO SOLN: 5.0000 mg | Freq: Once | ORAL | Status: AC | PRN

## 2024-07-26 MED ORDER — DROPERIDOL 2.5 MG/ML IJ SOLN: 0.6250 mg | Freq: Once | INTRAMUSCULAR | Status: DC | PRN

## 2024-07-26 MED ORDER — CHLORHEXIDINE GLUCONATE CLOTH 2 % EX PADS: 6.0000 | MEDICATED_PAD | Freq: Once | CUTANEOUS | Status: DC

## 2024-07-26 MED ORDER — FENTANYL CITRATE (PF) 100 MCG/2ML IJ SOLN: 25.0000 ug | INTRAMUSCULAR | Status: DC | PRN

## 2024-07-26 MED ORDER — LACTATED RINGERS IV SOLN
INTRAVENOUS | Status: DC | PRN
Start: 2024-07-26 — End: 2024-07-26

## 2024-07-26 MED ORDER — LIDOCAINE HCL (CARDIAC) PF 100 MG/5ML IV SOSY
PREFILLED_SYRINGE | INTRAVENOUS | Status: DC | PRN
  Administered 2024-07-26: 100 mg via INTRAVENOUS

## 2024-07-26 MED ORDER — GLYCOPYRROLATE 0.2 MG/ML IJ SOLN
INTRAMUSCULAR | Status: AC
  Filled 2024-07-26: qty 1

## 2024-07-26 MED ORDER — BUPIVACAINE LIPOSOME 1.3 % IJ SUSP: 10.0000 mL | Freq: Once | INTRAMUSCULAR | Status: DC

## 2024-07-26 MED ORDER — ROCURONIUM BROMIDE 100 MG/10ML IV SOLN
INTRAVENOUS | Status: DC | PRN
  Administered 2024-07-26: 30 mg via INTRAVENOUS
  Administered 2024-07-26: 70 mg via INTRAVENOUS

## 2024-07-26 MED ORDER — ONDANSETRON HCL 4 MG/2ML IJ SOLN
INTRAMUSCULAR | Status: AC
  Filled 2024-07-26: qty 2

## 2024-07-26 MED ORDER — ACETAMINOPHEN 500 MG PO TABS
ORAL_TABLET | ORAL | Status: AC
  Filled 2024-07-26: qty 2

## 2024-07-26 MED ORDER — PROPOFOL 1000 MG/100ML IV EMUL
INTRAVENOUS | Status: AC
  Filled 2024-07-26: qty 100

## 2024-07-26 MED ORDER — ONDANSETRON HCL 4 MG/2ML IJ SOLN
INTRAMUSCULAR | Status: DC | PRN
  Administered 2024-07-26: 4 mg via INTRAVENOUS

## 2024-07-26 MED ORDER — GLYCOPYRROLATE 0.2 MG/ML IJ SOLN
INTRAMUSCULAR | Status: DC | PRN
  Administered 2024-07-26: .2 mg via INTRAVENOUS

## 2024-07-26 MED ORDER — KETOROLAC TROMETHAMINE 30 MG/ML IJ SOLN
INTRAMUSCULAR | Status: AC
  Filled 2024-07-26: qty 1

## 2024-07-26 MED ORDER — ORAL CARE MOUTH RINSE: 15.0000 mL | Freq: Once | OROMUCOSAL | Status: AC

## 2024-07-26 MED ORDER — OXYCODONE HCL 5 MG PO TABS
ORAL_TABLET | ORAL | Status: AC
Start: 2024-07-26 — End: 2024-07-26
  Filled 2024-07-26: qty 1

## 2024-07-26 MED ORDER — LACTATED RINGERS IV SOLN: INTRAVENOUS | Status: DC

## 2024-07-26 MED ORDER — DEXMEDETOMIDINE HCL IN NACL 80 MCG/20ML IV SOLN
INTRAVENOUS | Status: DC | PRN
Start: 2024-07-26 — End: 2024-07-26
  Administered 2024-07-26: 16 ug via INTRAVENOUS
  Administered 2024-07-26: 4 ug via INTRAVENOUS

## 2024-07-26 MED ORDER — ACETAMINOPHEN 500 MG PO TABS: 1000.0000 mg | ORAL_TABLET | Freq: Four times a day (QID) | ORAL | Status: DC | PRN

## 2024-07-26 MED ORDER — ACETAMINOPHEN 500 MG PO TABS
1000.0000 mg | ORAL_TABLET | ORAL | Status: AC
  Administered 2024-07-26: 1000 mg via ORAL

## 2024-07-26 MED ORDER — ACETAMINOPHEN 10 MG/ML IV SOLN: 1000.0000 mg | Freq: Once | INTRAVENOUS | Status: DC | PRN

## 2024-07-26 MED ORDER — DEXAMETHASONE SODIUM PHOSPHATE 10 MG/ML IJ SOLN
INTRAMUSCULAR | Status: DC | PRN
  Administered 2024-07-26: 10 mg via INTRAVENOUS

## 2024-07-26 MED ORDER — DEXAMETHASONE SODIUM PHOSPHATE 10 MG/ML IJ SOLN
INTRAMUSCULAR | Status: AC
Start: 2024-07-26 — End: 2024-07-26
  Filled 2024-07-26: qty 1

## 2024-07-26 MED ORDER — ROCURONIUM BROMIDE 10 MG/ML (PF) SYRINGE
PREFILLED_SYRINGE | INTRAVENOUS | Status: AC
  Filled 2024-07-26: qty 10

## 2024-07-26 MED ORDER — GABAPENTIN 300 MG PO CAPS
300.0000 mg | ORAL_CAPSULE | ORAL | Status: AC
  Administered 2024-07-26: 300 mg via ORAL

## 2024-07-26 MED ORDER — KETOROLAC TROMETHAMINE 30 MG/ML IJ SOLN
30.0000 mg | Freq: Once | INTRAMUSCULAR | Status: AC
  Administered 2024-07-26: 30 mg via INTRAVENOUS

## 2024-07-26 MED ORDER — BUPIVACAINE LIPOSOME 1.3 % IJ SUSP
INTRAMUSCULAR | Status: DC | PRN
  Administered 2024-07-26: 50 mL

## 2024-07-26 MED ORDER — OXYCODONE HCL 5 MG PO TABS
5.0000 mg | ORAL_TABLET | Freq: Once | ORAL | Status: AC | PRN
  Administered 2024-07-26: 5 mg via ORAL

## 2024-07-26 MED ORDER — OXYCODONE HCL 5 MG PO TABS: 5.0000 mg | ORAL_TABLET | ORAL | 0 refills | Status: DC | PRN

## 2024-07-26 MED ORDER — MIDAZOLAM HCL 2 MG/2ML IJ SOLN
INTRAMUSCULAR | Status: AC
  Filled 2024-07-26: qty 2

## 2024-07-26 MED ORDER — PROPOFOL 10 MG/ML IV BOLUS
INTRAVENOUS | Status: DC | PRN
  Administered 2024-07-26: 150 ug/kg/min via INTRAVENOUS
  Administered 2024-07-26: 200 ug/kg/min via INTRAVENOUS

## 2024-07-26 MED ORDER — BUPIVACAINE LIPOSOME 1.3 % IJ SUSP
INTRAMUSCULAR | Status: AC
Start: 2024-07-26 — End: 2024-07-26
  Filled 2024-07-26: qty 20

## 2024-07-26 MED ORDER — FENTANYL CITRATE (PF) 100 MCG/2ML IJ SOLN
INTRAMUSCULAR | Status: DC | PRN
  Administered 2024-07-26 (×2): 50 ug via INTRAVENOUS

## 2024-07-26 MED ORDER — CEFAZOLIN SODIUM-DEXTROSE 3-4 GM/150ML-% IV SOLN
3.0000 g | INTRAVENOUS | Status: AC
  Administered 2024-07-26: 3 g via INTRAVENOUS
  Filled 2024-07-26: qty 150

## 2024-07-26 MED ORDER — FENTANYL CITRATE (PF) 100 MCG/2ML IJ SOLN
INTRAMUSCULAR | Status: AC
  Filled 2024-07-26: qty 2

## 2024-07-26 MED ORDER — BUPIVACAINE-EPINEPHRINE (PF) 0.5% -1:200000 IJ SOLN
INTRAMUSCULAR | Status: AC
  Filled 2024-07-26: qty 10

## 2024-07-26 SURGICAL SUPPLY — 24 items
BLADE SURG 15 STRL LF DISP TIS (BLADE) ×1 IMPLANT
BRIEF MESH DISP 2XL (UNDERPADS AND DIAPERS) ×1 IMPLANT
DRAPE LAPAROTOMY 100X77 ABD (DRAPES) ×1 IMPLANT
ELECTRODE CAUTERY BLDE TIP 2.5 (TIP) ×1 IMPLANT
ELECTRODE REM PT RTRN 9FT ADLT (ELECTROSURGICAL) ×1 IMPLANT
GAUZE SPONGE 4X4 12PLY STRL (GAUZE/BANDAGES/DRESSINGS) ×2 IMPLANT
GLOVE SURG SYN 7.0 PF PI (GLOVE) ×1 IMPLANT
GLOVE SURG SYN 7.5 PF PI (GLOVE) ×1 IMPLANT
GOWN STRL REUS W/ TWL LRG LVL3 (GOWN DISPOSABLE) ×2 IMPLANT
KIT TURNOVER KIT A (KITS) ×1 IMPLANT
LABEL OR SOLS (LABEL) IMPLANT
MANIFOLD NEPTUNE II (INSTRUMENTS) ×1 IMPLANT
NDL HYPO 22X1.5 SAFETY MO (MISCELLANEOUS) ×1 IMPLANT
NEEDLE HYPO 22X1.5 SAFETY MO (MISCELLANEOUS) ×1 IMPLANT
NS IRRIG 500ML POUR BTL (IV SOLUTION) ×1 IMPLANT
PACK BASIN MINOR ARMC (MISCELLANEOUS) ×1 IMPLANT
SOLUTION PREP PVP 2OZ (MISCELLANEOUS) ×1 IMPLANT
SUT ETHILON 2 0 FS 18 (SUTURE) IMPLANT
SUT VIC AB 2-0 SH 27XBRD (SUTURE) ×1 IMPLANT
SUT VIC AB 3-0 SH 27X BRD (SUTURE) ×1 IMPLANT
SYR 20ML LL LF (SYRINGE) ×1 IMPLANT
TAPE CLOTH SURG 4X10 WHT LF (GAUZE/BANDAGES/DRESSINGS) ×1 IMPLANT
TRAP FLUID SMOKE EVACUATOR (MISCELLANEOUS) ×1 IMPLANT
WATER STERILE IRR 500ML POUR (IV SOLUTION) ×1 IMPLANT

## 2024-07-26 NOTE — Progress Notes (Signed)
 Patient provided instructions and dressing supplies needed for home care.

## 2024-07-26 NOTE — Anesthesia Preprocedure Evaluation (Addendum)
 Anesthesia Evaluation  Patient identified by MRN, date of birth, ID band Patient awake    Reviewed: Allergy & Precautions, H&P , NPO status , Patient's Chart, lab work & pertinent test results  Airway Mallampati: IV  TM Distance: >3 FB Neck ROM: full    Dental no notable dental hx.    Pulmonary neg pulmonary ROS   Pulmonary exam normal        Cardiovascular negative cardio ROS Normal cardiovascular exam     Neuro/Psych negative neurological ROS  negative psych ROS   GI/Hepatic negative GI ROS, Neg liver ROS,,,  Endo/Other    Class 4 obesity  Renal/GU      Musculoskeletal   Abdominal  (+) + obese  Peds  Hematology negative hematology ROS (+)   Anesthesia Other Findings Past Medical History: No date: Amenorrhea No date: Elevated BP without diagnosis of hypertension No date: Obesity No date: Pilonidal cyst No date: Prediabetes No date: Scoliosis (concave RIGHT)  Past Surgical History: No date: TONSILLECTOMY AND ADENOIDECTOMY     Reproductive/Obstetrics negative OB ROS                              Anesthesia Physical Anesthesia Plan  ASA: 3  Anesthesia Plan: General ETT   Post-op Pain Management: Tylenol  PO (pre-op)*, Gabapentin  PO (pre-op)*, Celebrex PO (pre-op)* and Regional block*   Induction: Intravenous  PONV Risk Score and Plan: 2 and Ondansetron , Dexamethasone  and Midazolam   Airway Management Planned: Oral ETT  Additional Equipment:   Intra-op Plan:   Post-operative Plan: Extubation in OR  Informed Consent: I have reviewed the patients History and Physical, chart, labs and discussed the procedure including the risks, benefits and alternatives for the proposed anesthesia with the patient or authorized representative who has indicated his/her understanding and acceptance.     Dental Advisory Given  Plan Discussed with: CRNA and Surgeon  Anesthesia Plan  Comments:          Anesthesia Quick Evaluation

## 2024-07-26 NOTE — Interval H&P Note (Signed)
 History and Physical Interval Note:  07/26/2024 11:23 AM  Vanessa Klein  has presented today for surgery, with the diagnosis of Pilonidal cyst.  The various methods of treatment have been discussed with the patient and family. After consideration of risks, benefits and other options for treatment, the patient has consented to  Procedure(s): EXCISION, PILONIDAL CYST, EXTENSIVE (N/A) as a surgical intervention.  The patient's history has been reviewed, patient examined, no change in status, stable for surgery.  I have reviewed the patient's chart and labs.  Questions were answered to the patient's satisfaction.     Kamren Heintzelman

## 2024-07-26 NOTE — Op Note (Signed)
  Procedure Date:  07/26/2024  Pre-operative Diagnosis:  Pilonidal cyst  Post-operative Diagnosis: Pilonidal cyst  Procedure:  Pilonidal cyst excision  Surgeon:  Aloysius Sheree Plant, MD  Anesthesia:  General endotracheal  Estimated Blood Loss:  10 ml  Specimens:  Pilonidal cyst  Complications:  None  Indications for Procedure:  This is a 28 y.o. female with a previous pilonidal cyst infection.  The options of surgery versus observation were reviewed with the patient and/or family. The risks of bleeding, infection, recurrence of symptoms, abscess or infection, were all discussed with the patient and she was willing to proceed.  Description of Procedure: The patient was correctly identified in the preoperative area and brought into the operating room.  The patient was placed supine with VTE prophylaxis in place.  Appropriate time-outs were performed.  Anesthesia was induced and the patient was intubated.  The patient was then placed in prone position. Appropriate antibiotics were infused.  The pilonidal area was prepped and draped in usual sterile fashion.  A lacrimal probe was inserted into the pilonidal sinus, and it tracked superiorly about 4 cm and deeply about 4 cm as well.  It did not track inferiorly.  A 6 cm elliptical incision was made, incorporating the pilonidal cyst and pit.  Cautery was used to dissect down the subcutaneous tissues to the cyst and the cyst with skin was excised intact using cautery.  This required dissecting laterally bilaterally creating skin flaps due to the width of the cyst, and also dissecting deep due to the depth of the cyst which abutted the sacral fascia.  This was sent to pathology.  The cavity was irrigated and hemostasis was assured with cautery.  Local anesthetic was infiltrated into the skin and subcutaneous tissue of the cavity.  Due to the width of the cavity, the skin flap were widened a bit to allow less tension with the deeper layers.  The cavity was  then closed in multiple layers using 2-0 Vicryl sutures, 3-0 Vicryl sutures, and 2-0 Nylon sutures for the skin.  The incision was cleaned and dressed with gauze and Tegaderm.  The patient was then placed back on supine position, emerged from anesthesia, extubated, and brought to the recovery room for further management.  The patient tolerated the procedure well and all counts were correct at the end of the case.   Aloysius Sheree Plant, MD

## 2024-07-26 NOTE — Anesthesia Postprocedure Evaluation (Signed)
 Anesthesia Post Note  Patient: Vanessa Klein  Procedure(s) Performed: EXCISION, PILONIDAL CYST, EXTENSIVE  Patient location during evaluation: PACU Anesthesia Type: General Level of consciousness: awake and alert Pain management: pain level controlled Vital Signs Assessment: post-procedure vital signs reviewed and stable Respiratory status: spontaneous breathing, nonlabored ventilation and respiratory function stable Cardiovascular status: blood pressure returned to baseline and stable Postop Assessment: no apparent nausea or vomiting Anesthetic complications: no   There were no known notable events for this encounter.   Last Vitals:  Vitals:   07/26/24 1439 07/26/24 1453  BP: (!) 113/56 (!) 142/57  Pulse: (!) 59 64  Resp: 12 15  Temp: 37.3 C   SpO2: 95% 96%    Last Pain:  Vitals:   07/26/24 1453  TempSrc: Temporal  PainSc: 4                  Camellia Merilee Louder

## 2024-07-26 NOTE — Anesthesia Procedure Notes (Signed)
 Procedure Name: Intubation Date/Time: 07/26/2024 11:58 AM  Performed by: Bonnetta Jimmey SAUNDERS, CRNAPre-anesthesia Checklist: Patient identified, Emergency Drugs available, Suction available and Patient being monitored Patient Re-evaluated:Patient Re-evaluated prior to induction Oxygen Delivery Method: Circle system utilized Preoxygenation: Pre-oxygenation with 100% oxygen Induction Type: IV induction Ventilation: Mask ventilation without difficulty Laryngoscope Size: McGrath and 3 Grade View: Grade I Tube type: Oral Tube size: 7.0 mm Number of attempts: 1 Airway Equipment and Method: Stylet and Oral airway Placement Confirmation: ETT inserted through vocal cords under direct vision, positive ETCO2 and breath sounds checked- equal and bilateral Secured at: 22 cm Tube secured with: Tape Dental Injury: Teeth and Oropharynx as per pre-operative assessment

## 2024-07-26 NOTE — Transfer of Care (Signed)
 Immediate Anesthesia Transfer of Care Note  Patient: Vanessa Klein  Procedure(s) Performed: EXCISION, PILONIDAL CYST, EXTENSIVE  Patient Location: PACU  Anesthesia Type:General  Level of Consciousness: awake  Airway & Oxygen Therapy: Patient Spontanous Breathing and Patient connected to face mask oxygen  Post-op Assessment: Report given to RN and Post -op Vital signs reviewed and stable  Post vital signs: Reviewed and stable  Last Vitals:  Vitals Value Taken Time  BP 129/68 07/26/24 13:46  Temp 37.1 C 07/26/24 13:45  Pulse 67 07/26/24 13:49  Resp 0 07/26/24 13:49  SpO2 100 % 07/26/24 13:49  Vitals shown include unfiled device data.  Last Pain:  Vitals:   07/26/24 1107  PainSc: 0-No pain         Complications: There were no known notable events for this encounter.

## 2024-07-26 NOTE — Discharge Instructions (Signed)
 Discharge Instructions: 1.  Patient may shower, but do not scrub wounds heavily and dab dry only. 2.  Do not submerge wounds in pool/tub until fully healed. 3.  Do not remove sutures 4.  Place dry gauze dressing over the incision and secure with tape.  Please change once daily starting 07/27/24 and as needed to keep the area clean and dry. 5.  When sitting, try to sit with your weight more on one buttock or the other, and avoid sitting directly in the center of the buttocks to decrease the amount of tension on the incision. 6.  OK to ambulate and go up/down stairs as needed.  Would avoid strenuous activity, particularly anything that will cause a lot of sweat in the area.

## 2024-07-27 ENCOUNTER — Encounter: Payer: Self-pay | Admitting: Surgery

## 2024-07-28 LAB — SURGICAL PATHOLOGY

## 2024-08-01 ENCOUNTER — Ambulatory Visit (INDEPENDENT_AMBULATORY_CARE_PROVIDER_SITE_OTHER): Admitting: Surgery

## 2024-08-01 ENCOUNTER — Encounter: Payer: Self-pay | Admitting: Surgery

## 2024-08-01 VITALS — BP 114/72 | HR 91 | Temp 98.7°F | Ht 64.0 in | Wt 317.6 lb

## 2024-08-01 DIAGNOSIS — L0501 Pilonidal cyst with abscess: Secondary | ICD-10-CM

## 2024-08-01 DIAGNOSIS — Z09 Encounter for follow-up examination after completed treatment for conditions other than malignant neoplasm: Secondary | ICD-10-CM

## 2024-08-01 DIAGNOSIS — L0591 Pilonidal cyst without abscess: Secondary | ICD-10-CM

## 2024-08-01 NOTE — Progress Notes (Signed)
 08/01/2024  HPI: Vanessa Klein is a 28 y.o. female s/p pilonidal cyst excision on 07/26/2024.  Patient presents today for follow-up.  She reports she has been doing well and denies any pain in the gluteal cleft area.  Denies any drainage on her gauze dressing either.  Vital signs: BP 114/72   Pulse 91   Temp 98.7 F (37.1 C) (Oral)   Ht 5' 4 (1.626 m)   Wt (!) 317 lb 9.6 oz (144.1 kg)   LMP  (LMP Unknown)   SpO2 98%   BMI 54.52 kg/m    Physical Exam: Constitutional: No acute distress Skin: Gluteal cleft incision is healing appropriately without any drainage or evidence of infection.  There is mild skin changes likely from initial ischemia in the center of the incision but no wound opening or drainage noted.  3 of the 7 sutures were removed today.  Dry gauze dressing applied.  Assessment/Plan: This is a 28 y.o. female s/p pilonidal cyst excision.  - Patient is healing appropriately right now with no significant pain issues at this point.  3 of the 7 sutures were removed today and dry gauze dressing applied.  Patient will follow-up with me next week to remove the remaining sutures. - All of her questions have been answered.   Aloysius Sheree Plant, MD [facility]

## 2024-08-01 NOTE — Patient Instructions (Signed)
Pilonidal Cyst Removal, Care After The following information offers guidance on how to care for yourself after your procedure. Your health care provider may also give you more specific instructions. If you have problems or questions, contact your health care provider. What can I expect after the procedure? After the procedure, it is common to have: Pain. Redness. Some swelling. Some fluid or blood coming from your incision (drainage). You may have more drainage if you have an open incision. Follow these instructions at home: Medicines Take over-the-counter and prescription medicines only as told by your health care provider. If you were prescribed antibiotics, take them as told by your health care provider. Do not stop using the antibiotic even if you start to feel better. Ask your health care provider if the medicine prescribed to you: Requires you to avoid driving or using machinery. Can cause constipation. You may need to take these actions to prevent or treat constipation: Drink enough fluid to keep your urine pale yellow. Take over-the-counter or prescription medicines. Eat foods that are high in fiber, such as beans, whole grains, and fresh fruits and vegetables. Limit foods that are high in fat and processed sugars, such as fried or sweet foods. Incision care  You may need to have a caregiver help you with wound care and dressing changes. Follow instructions from your health care provider about how to take care of your incision. Make sure you: Wash your hands with soap and water for at least 20 seconds before and after you change your bandage (dressing). If soap and water are not available, use hand sanitizer. Change your dressing as told by your health care provider. Leave stitches (sutures), skin glue, or adhesive strips in place. These skin closures may need to stay in place for 2 weeks or longer. If adhesive strip edges start to loosen and curl up, you may trim the loose edges. Do  not remove adhesive strips completely unless your health care provider tells you to do that. Check your incision area every day for signs of infection. If it is hard to see the area, have someone check for you. Check for: More redness, swelling, or pain. More fluid or blood. Warmth. Pus or a bad smell. Managing pain and swelling If directed, put ice on the affected area. To do this: Put ice in a plastic bag. Place a towel between your skin and the bag. Leave the ice on for 20 minutes, 2-3 times a day. If your skin turns bright red, remove the ice right away to prevent skin damage. The risk of skin damage is higher if you cannot feel pain, heat, or cold. Activity Do not do activities that cause pain or irritate the incision area. These may include bike riding, running, sit ups, or anything that involves a twisting motion. Rest as told by your health care provider. Do not sit for a long time without moving. Get up to take short walks every 1-2 hours. This will improve blood flow and breathing. Ask for help if you feel weak or unsteady. Return to your normal activities as told by your health care provider. Ask your health care provider what activities are safe for you. General instructions Do not use any products that contain nicotine or tobacco. These products include cigarettes, chewing tobacco, and vaping devices, such as e-cigarettes. These can delay healing after surgery. If you need help quitting, ask your health care provider. Do not take baths, swim, or use a hot tub until your health care provider approves.   Ask your health care provider if you may take showers. You may only be allowed to take sponge baths. Keep all follow-up visits. This is important to monitor healing. If you had a procedure with wound packing, your packing may be changed or removed at follow-up visits. Contact a health care provider if: You have pain that does not get better with medicine. You have any of these signs  of infection: More redness, swelling, or pain around your incision. More fluid or blood coming from your incision. Warmth coming from your incision. Pus or a bad smell coming from your incision. A fever. Get help right away if: You have severe pain in your abdomen. You have sudden chest pain and shortness of breath. You cough up blood. You faint or lose consciousness. These symptoms may be an emergency. Get help right away. Call 911. Do not wait to see if the symptoms will go away. Do not drive yourself to the hospital. Summary It is common to have some pain and drainage after your procedure. You may have more drainage if you have an open incision. You may need to have a caregiver help you with wound care and dressing changes. Do not do activities that cause pain or irritate the incision area. Contact your health care provider if you have pain that does not get better with medicine or if you have any signs of infection. This information is not intended to replace advice given to you by your health care provider. Make sure you discuss any questions you have with your health care provider. Document Revised: 03/21/2022 Document Reviewed: 03/21/2022 Elsevier Patient Education  2024 [employer]  

## 2024-08-05 ENCOUNTER — Telehealth: Payer: Self-pay | Admitting: *Deleted

## 2024-08-05 NOTE — Telephone Encounter (Signed)
 Faxed FMLA to [employer] at [phone]

## 2024-08-08 ENCOUNTER — Ambulatory Visit (INDEPENDENT_AMBULATORY_CARE_PROVIDER_SITE_OTHER): Admitting: Surgery

## 2024-08-08 ENCOUNTER — Encounter: Payer: Self-pay | Admitting: Surgery

## 2024-08-08 VITALS — BP 113/82 | HR 101 | Temp 98.4°F | Ht 64.0 in | Wt 316.6 lb

## 2024-08-08 DIAGNOSIS — L0501 Pilonidal cyst with abscess: Secondary | ICD-10-CM

## 2024-08-08 DIAGNOSIS — Z09 Encounter for follow-up examination after completed treatment for conditions other than malignant neoplasm: Secondary | ICD-10-CM

## 2024-08-08 NOTE — Progress Notes (Signed)
 08/08/2024  HPI: Vanessa Klein is a 28 y.o. female s/p pilonidal cyst excision on 07/26/2024.  Patient presents today for follow-up.  She was last seen a week ago at which time 3 of the 7 sutures were removed.  Today, the patient reports that over the weekend she had some bleeding from the wound as she was doing cleaning around her home.  Otherwise denies any worsening pain or issues.  Vital signs: BP 113/82   Pulse (!) 101   Temp 98.4 F (36.9 C) (Oral)   Ht 5' 4 (1.626 m)   Wt (!) 316 lb 9.6 oz (143.6 kg)   LMP  (LMP Unknown)   SpO2 98%   BMI 54.34 kg/m    Physical Exam: Constitutional: No acute distress Skin: The patient's gluteal cleft incision has an area of dehiscence inferiorly of about 3 mm width and 3 mm depth.  The top half of the wound remains intact.  The remaining 4 nylon sutures were removed today.  Gauze dressing applied.  Assessment/Plan: This is a 28 y.o. female s/p pilonidal cyst excision.  - Discussed with the patient that the inferior portion of the wound has opened up slightly although without significant depth.  Discussed with her now that the sutures have been removed, that bottom area may open up will be more but this should still be a shallow wound.  The top half of the wound is doing well.  Discussed with her doing a dry gauze dressing change at least daily to keep the area clean and dry. - Follow-up in 2 weeks to check on her wound.   Aloysius Sheree Plant, MD [facility]

## 2024-08-08 NOTE — Patient Instructions (Addendum)
 Change the gauze daily, and keep clean and dry, if the gauze gets wet, put a dry one on    Pilonidal Cyst Removal, Care After The following information offers guidance on how to care for yourself after your procedure. Your health care provider may also give you more specific instructions. If you have problems or questions, contact your health care provider. What can I expect after the procedure? After the procedure, it is common to have: Pain. Redness. Some swelling. Some fluid or blood coming from your incision (drainage). You may have more drainage if you have an open incision. Follow these instructions at home: Medicines Take over-the-counter and prescription medicines only as told by your health care provider. If you were prescribed antibiotics, take them as told by your health care provider. Do not stop using the antibiotic even if you start to feel better. Ask your health care provider if the medicine prescribed to you: Requires you to avoid driving or using machinery. Can cause constipation. You may need to take these actions to prevent or treat constipation: Drink enough fluid to keep your urine pale yellow. Take over-the-counter or prescription medicines. Eat foods that are high in fiber, such as beans, whole grains, and fresh fruits and vegetables. Limit foods that are high in fat and processed sugars, such as fried or sweet foods. Incision care  You may need to have a caregiver help you with wound care and dressing changes. Follow instructions from your health care provider about how to take care of your incision. Make sure you: Wash your hands with soap and water for at least 20 seconds before and after you change your bandage (dressing). If soap and water are not available, use hand sanitizer. Change your dressing as told by your health care provider. Leave stitches (sutures), skin glue, or adhesive strips in place. These skin closures may need to stay in place for 2 weeks or  longer. If adhesive strip edges start to loosen and curl up, you may trim the loose edges. Do not remove adhesive strips completely unless your health care provider tells you to do that. Check your incision area every day for signs of infection. If it is hard to see the area, have someone check for you. Check for: More redness, swelling, or pain. More fluid or blood. Warmth. Pus or a bad smell. Managing pain and swelling If directed, put ice on the affected area. To do this: Put ice in a plastic bag. Place a towel between your skin and the bag. Leave the ice on for 20 minutes, 2-3 times a day. If your skin turns bright red, remove the ice right away to prevent skin damage. The risk of skin damage is higher if you cannot feel pain, heat, or cold. Activity Do not do activities that cause pain or irritate the incision area. These may include bike riding, running, sit ups, or anything that involves a twisting motion. Rest as told by your health care provider. Do not sit for a long time without moving. Get up to take short walks every 1-2 hours. This will improve blood flow and breathing. Ask for help if you feel weak or unsteady. Return to your normal activities as told by your health care provider. Ask your health care provider what activities are safe for you. General instructions Do not use any products that contain nicotine or tobacco. These products include cigarettes, chewing tobacco, and vaping devices, such as e-cigarettes. These can delay healing after surgery. If you need help  quitting, ask your health care provider. Do not take baths, swim, or use a hot tub until your health care provider approves. Ask your health care provider if you may take showers. You may only be allowed to take sponge baths. Keep all follow-up visits. This is important to monitor healing. If you had a procedure with wound packing, your packing may be changed or removed at follow-up visits. Contact a health care  provider if: You have pain that does not get better with medicine. You have any of these signs of infection: More redness, swelling, or pain around your incision. More fluid or blood coming from your incision. Warmth coming from your incision. Pus or a bad smell coming from your incision. A fever. Get help right away if: You have severe pain in your abdomen. You have sudden chest pain and shortness of breath. You cough up blood. You faint or lose consciousness. These symptoms may be an emergency. Get help right away. Call 911. Do not wait to see if the symptoms will go away. Do not drive yourself to the hospital. Summary It is common to have some pain and drainage after your procedure. You may have more drainage if you have an open incision. You may need to have a caregiver help you with wound care and dressing changes. Do not do activities that cause pain or irritate the incision area. Contact your health care provider if you have pain that does not get better with medicine or if you have any signs of infection. This information is not intended to replace advice given to you by your health care provider. Make sure you discuss any questions you have with your health care provider. Document Revised: 03/21/2022 Document Reviewed: 03/21/2022 Elsevier Patient Education  2024 [employer].

## 2024-08-16 ENCOUNTER — Telehealth: Payer: Self-pay | Admitting: *Deleted

## 2024-08-16 NOTE — Telephone Encounter (Signed)
 Faxed FMLA to [employer] at [phone]

## 2024-08-22 ENCOUNTER — Ambulatory Visit (INDEPENDENT_AMBULATORY_CARE_PROVIDER_SITE_OTHER): Admitting: Surgery

## 2024-08-22 ENCOUNTER — Encounter: Payer: Self-pay | Admitting: Surgery

## 2024-08-22 VITALS — BP 126/87 | HR 79 | Temp 98.3°F | Ht 64.0 in | Wt 321.0 lb

## 2024-08-22 DIAGNOSIS — L0501 Pilonidal cyst with abscess: Secondary | ICD-10-CM

## 2024-08-22 DIAGNOSIS — L0591 Pilonidal cyst without abscess: Secondary | ICD-10-CM

## 2024-08-22 DIAGNOSIS — Z09 Encounter for follow-up examination after completed treatment for conditions other than malignant neoplasm: Secondary | ICD-10-CM

## 2024-08-22 NOTE — Progress Notes (Signed)
 08/22/2024  HPI: Vanessa Klein is a 28 y.o. female s/p excision of pilonidal cyst on 07/26/2024.  Patient presents today for follow-up.  She reports that she feels that the inferior portion of the wound opened up a little bit more since her last visit.  Denies any tenderness.  Reports only mild drainage.  She is not able to find an appropriate dressing that sticks well to her skin so she is using a Band-Aid.  Denies any fevers.  Vital signs: BP 126/87   Pulse 79   Temp 98.3 F (36.8 C) (Oral)   Ht 5' 4 (1.626 m)   Wt (!) 321 lb (145.6 kg)   SpO2 99%   BMI 55.10 kg/m    Physical Exam: Constitutional: No acute distress Skin: Incision of the gluteal cleft has an inferior portion that is open with a width of about 3 mm, length of about 1.5 cm and depth of about 5 mm.  There is some granulation tissue indicating that the wound is trying to heal.  No drainage noted.  Silver nitrate was applied to the wound.  Dressed with dry gauze dressing.  Assessment/Plan: This is a 28 y.o. female s/p pilonidal cyst excision.  - Discussed with patient that indeed the wound has opened up perhaps a bit more.  However there is granulation tissue forming indicating that the wound is trying to also heal.  Applied silver nitrate to see if this can help with the wound healing process.  Instructed her to do daily dressing changes.  Also instructed on restrictions with no submerging of the wound until it is fully healed. - Follow-up in 3 weeks.   Aloysius Sheree Plant, MD [facility]

## 2024-08-22 NOTE — Patient Instructions (Addendum)
 We use some Silver Nitrate on your wound, it will cause some stinging and burning, but this will help the wound heal.   Continue to do daily dressing changes and keep the area clean and dry.

## 2024-09-12 ENCOUNTER — Ambulatory Visit (INDEPENDENT_AMBULATORY_CARE_PROVIDER_SITE_OTHER): Admitting: Surgery

## 2024-09-12 ENCOUNTER — Encounter: Payer: Self-pay | Admitting: Surgery

## 2024-09-12 VITALS — BP 123/76 | HR 68 | Ht 64.0 in | Wt 325.0 lb

## 2024-09-12 DIAGNOSIS — L0591 Pilonidal cyst without abscess: Secondary | ICD-10-CM

## 2024-09-12 DIAGNOSIS — L0501 Pilonidal cyst with abscess: Secondary | ICD-10-CM

## 2024-09-12 DIAGNOSIS — Z09 Encounter for follow-up examination after completed treatment for conditions other than malignant neoplasm: Secondary | ICD-10-CM

## 2024-09-12 NOTE — Progress Notes (Signed)
 09/12/2024  HPI: CARLISHA WISLER is a 28 y.o. female s/p pilonidal cyst excision on 07/26/2024.  Patient presents today for follow-up.  She was last seen on 08/18/2024 at which time the inferior portion of the incision had an opening of about 1.5 cm x 3 mm.  Silver nitrate was applied and she presents today for follow-up.  She reports that she is not having any pain in the area and reports that the drainage is decreasing as well.  Vital signs: BP 123/76   Pulse 68   Ht 5' 4 (1.626 m)   Wt (!) 325 lb (147.4 kg)   SpO2 99%   BMI 55.79 kg/m    Physical Exam: Constitutional: No acute distress Skin: Inferior portion of the cleft incision remains open with some improved healing but similar size as before.  New round of silver nitrate was applied.  She did have some hairs that were trying to grow into the wound so these were removed.  There are some moisture changes on the edges of the skin but no evidence of any infection.  Dry gauze dressing applied  Assessment/Plan: This is a 28 y.o. female s/p pilonidal cyst excision.  - No significant improvement in the patient's inferior gluteal cleft incision.  Silver nitrate applied again.  Some ingrown hairs were removed.  Advised to do more frequent dressing changes, likely twice daily particularly if there is any increased voiding or drainage to avoid skin excoriation.  This will continue to heal but it may take some time. - Follow-up in 2 more weeks.   Aloysius Sheree Plant, MD [facility]

## 2024-09-12 NOTE — Patient Instructions (Signed)
 Follow-up with our office in 2 weeks.  Please call and ask to speak with a nurse if you develop questions or concerns.   Change your dressing ideally twice a day if able to keep it dry. Do remove your dressing before you shower and cleanse the area letting the warm soapy water run over the area, rinse well, and replace the dressing.   Ok to return to work without restrictions.

## 2024-09-26 ENCOUNTER — Encounter: Payer: Self-pay | Admitting: Surgery

## 2024-09-26 ENCOUNTER — Ambulatory Visit (INDEPENDENT_AMBULATORY_CARE_PROVIDER_SITE_OTHER): Admitting: Surgery

## 2024-09-26 VITALS — BP 114/74 | HR 76 | Temp 98.5°F | Ht 64.0 in | Wt 322.2 lb

## 2024-09-26 DIAGNOSIS — L0591 Pilonidal cyst without abscess: Secondary | ICD-10-CM

## 2024-09-26 DIAGNOSIS — Z09 Encounter for follow-up examination after completed treatment for conditions other than malignant neoplasm: Secondary | ICD-10-CM

## 2024-09-26 DIAGNOSIS — L0501 Pilonidal cyst with abscess: Secondary | ICD-10-CM

## 2024-09-26 NOTE — Patient Instructions (Signed)
 Please give our office a call if you have any questions or concerns

## 2024-09-26 NOTE — Progress Notes (Signed)
 09/26/2024  HPI: Vanessa Klein is a 28 y.o. female s/p pilonidal cyst excision on 07/26/2024.  Patient presents today for follow-up.  She denies any pain or discomfort at the gluteal cleft and denies any drainage.  Vital signs: BP 114/74   Pulse 76   Temp 98.5 F (36.9 C) (Oral)   Ht 5' 4 (1.626 m)   Wt (!) 322 lb 3.2 oz (146.1 kg)   SpO2 97%   BMI 55.31 kg/m    Physical Exam: Constitutional: No acute distress Skin: Gluteal cleft wound is now smaller measuring about 1 cm x 2 mm.  Continues to heal well without any drainage or significant depth.  Otherwise the rest of the incision is well-healed.  Assessment/Plan: This is a 28 y.o. female s/p pilonidal cyst excision  - Discussed with patient that her wound continues to heal well and is getting smaller in size.  I think at this point there is no specific depth to the wound either and no silver nitrate is needed.  Discussed with her that the skin will just start healing over this remnant of the wound.  No dressings necessary at this point unless there is any drainage or this to keep things clean. - Follow-up as needed.   Aloysius Sheree Plant, MD [facility]

## 2024-12-25 ENCOUNTER — Encounter: Payer: Self-pay | Admitting: Surgery

## 2025-01-04 ENCOUNTER — Ambulatory Visit: Admitting: Surgery

## 2025-01-13 ENCOUNTER — Ambulatory Visit: Admitting: Surgery

## 2025-01-13 ENCOUNTER — Encounter: Payer: Self-pay | Admitting: Surgery

## 2025-01-13 VITALS — BP 115/76 | HR 86 | Temp 98.7°F | Ht 64.0 in | Wt 321.4 lb

## 2025-01-13 DIAGNOSIS — Z09 Encounter for follow-up examination after completed treatment for conditions other than malignant neoplasm: Secondary | ICD-10-CM

## 2025-01-13 DIAGNOSIS — L0501 Pilonidal cyst with abscess: Secondary | ICD-10-CM

## 2025-01-13 DIAGNOSIS — L0591 Pilonidal cyst without abscess: Secondary | ICD-10-CM | POA: Diagnosis not present

## 2025-01-13 NOTE — Patient Instructions (Signed)
Pilonidal Cyst Removal, Care After The following information offers guidance on how to care for yourself after your procedure. Your health care provider may also give you more specific instructions. If you have problems or questions, contact your health care provider. What can I expect after the procedure? After the procedure, it is common to have: Pain. Redness. Some swelling. Some fluid or blood coming from your incision (drainage). You may have more drainage if you have an open incision. Follow these instructions at home: Medicines Take over-the-counter and prescription medicines only as told by your health care provider. If you were prescribed antibiotics, take them as told by your health care provider. Do not stop using the antibiotic even if you start to feel better. Ask your health care provider if the medicine prescribed to you: Requires you to avoid driving or using machinery. Can cause constipation. You may need to take these actions to prevent or treat constipation: Drink enough fluid to keep your urine pale yellow. Take over-the-counter or prescription medicines. Eat foods that are high in fiber, such as beans, whole grains, and fresh fruits and vegetables. Limit foods that are high in fat and processed sugars, such as fried or sweet foods. Incision care  You may need to have a caregiver help you with wound care and dressing changes. Follow instructions from your health care provider about how to take care of your incision. Make sure you: Wash your hands with soap and water for at least 20 seconds before and after you change your bandage (dressing). If soap and water are not available, use hand sanitizer. Change your dressing as told by your health care provider. Leave stitches (sutures), skin glue, or adhesive strips in place. These skin closures may need to stay in place for 2 weeks or longer. If adhesive strip edges start to loosen and curl up, you may trim the loose edges. Do  not remove adhesive strips completely unless your health care provider tells you to do that. Check your incision area every day for signs of infection. If it is hard to see the area, have someone check for you. Check for: More redness, swelling, or pain. More fluid or blood. Warmth. Pus or a bad smell. Managing pain and swelling If directed, put ice on the affected area. To do this: Put ice in a plastic bag. Place a towel between your skin and the bag. Leave the ice on for 20 minutes, 2-3 times a day. If your skin turns bright red, remove the ice right away to prevent skin damage. The risk of skin damage is higher if you cannot feel pain, heat, or cold. Activity Do not do activities that cause pain or irritate the incision area. These may include bike riding, running, sit ups, or anything that involves a twisting motion. Rest as told by your health care provider. Do not sit for a long time without moving. Get up to take short walks every 1-2 hours. This will improve blood flow and breathing. Ask for help if you feel weak or unsteady. Return to your normal activities as told by your health care provider. Ask your health care provider what activities are safe for you. General instructions Do not use any products that contain nicotine or tobacco. These products include cigarettes, chewing tobacco, and vaping devices, such as e-cigarettes. These can delay healing after surgery. If you need help quitting, ask your health care provider. Do not take baths, swim, or use a hot tub until your health care provider approves.   Ask your health care provider if you may take showers. You may only be allowed to take sponge baths. Keep all follow-up visits. This is important to monitor healing. If you had a procedure with wound packing, your packing may be changed or removed at follow-up visits. Contact a health care provider if: You have pain that does not get better with medicine. You have any of these signs  of infection: More redness, swelling, or pain around your incision. More fluid or blood coming from your incision. Warmth coming from your incision. Pus or a bad smell coming from your incision. A fever. Get help right away if: You have severe pain in your abdomen. You have sudden chest pain and shortness of breath. You cough up blood. You faint or lose consciousness. These symptoms may be an emergency. Get help right away. Call 911. Do not wait to see if the symptoms will go away. Do not drive yourself to the hospital. Summary It is common to have some pain and drainage after your procedure. You may have more drainage if you have an open incision. You may need to have a caregiver help you with wound care and dressing changes. Do not do activities that cause pain or irritate the incision area. Contact your health care provider if you have pain that does not get better with medicine or if you have any signs of infection. This information is not intended to replace advice given to you by your health care provider. Make sure you discuss any questions you have with your health care provider. Document Revised: 03/21/2022 Document Reviewed: 03/21/2022 Elsevier Patient Education  2024 [employer]  

## 2025-01-13 NOTE — Progress Notes (Signed)
 " 01/13/2025  History of Present Illness: Vanessa Klein is a 29 y.o. female status post excision of pilonidal cyst on 07/26/2024.  Patient called to set up an appointment because she has still had issues with drainage from the incision area at the gluteal cleft.  Denies any pain or swelling and reports that the drainage has been somewhat clear fluid in nature.  Denies any purulence.  The last visit with me was on 09/26/2024 at which time the wound was almost healed and getting smaller.  Past Medical History: Past Medical History:  Diagnosis Date   Amenorrhea    Elevated BP without diagnosis of hypertension    Obesity    Pilonidal cyst    Prediabetes    Scoliosis (concave RIGHT)      Past Surgical History: Past Surgical History:  Procedure Laterality Date   PILONIDAL CYST EXCISION N/A 07/26/2024   Procedure: EXCISION, PILONIDAL CYST, EXTENSIVE;  Surgeon: Desiderio Schanz, MD;  Location: [facility] ORS;  Service: General;  Laterality: N/A;   TONSILLECTOMY AND ADENOIDECTOMY      Home Medications: Prior to Admission medications  Not on File    Allergies: Allergies[1]  Review of Systems: Review of Systems  Constitutional:  Negative for chills and fever.  Respiratory:  Negative for shortness of breath.   Cardiovascular:  Negative for chest pain.  Skin:        Drainage from gluteal cleft wound.    Physical Exam BP 115/76   Pulse 86   Temp 98.7 F (37.1 C) (Oral)   Ht 5' 4 (1.626 m)   Wt (!) 321 lb 6.4 oz (145.8 kg)   SpO2 98%   BMI 55.17 kg/m  CONSTITUTIONAL: No acute distress HEENT:  Normocephalic, atraumatic, extraocular motion intact. RESPIRATORY:  Normal respiratory effort without pathologic use of accessory muscles. SKIN: Gluteal cleft wound remains open measuring about 1.2 cm x 4 mm.  I was able to probe the wound with a Q-tip which goes to a depth of about 5 mm.  There is healthy granulation tissue without any purulence, fibrinous material, or necrotic tissue.  Silver  nitrate was applied to the wound and a dry gauze dressing applied. NEUROLOGIC:  Motor and sensation is grossly normal.  Cranial nerves are grossly intact. PSYCH:  Alert and oriented to person, place and time. Affect is normal.   Assessment and Plan: This is a 29 y.o. female status post pilonidal cyst excision, with a persistent open wound.  - Discussed with patient that the inferior portion of the wound remains open and slightly bigger than before.  I was able to probed this with a Q-tip to a depth of about 5 mm.  However no complications otherwise that would require any procedures or antibiotics.  We applied silver nitrate to this area to promote further scarring so this can heal better.  Discussed with patient that potentially the skin was trying to close too early without the deeper portions close enough which allow for this area to remain open to drain. - Will follow-up with the patient in 2 weeks to check on the wound.  I spent 10 minutes dedicated to the care of this patient on the date of this encounter to include pre-visit review of records, face-to-face time with the patient discussing diagnosis and management, and any post-visit coordination of care.   Schanz Sheree Desiderio, MD [facility]        [1]  Allergies Allergen Reactions   Other Swelling    Beef and  pork   "

## 2025-01-30 ENCOUNTER — Ambulatory Visit: Admitting: Surgery

## 2025-02-06 ENCOUNTER — Ambulatory Visit: Admitting: Surgery
